# Patient Record
Sex: Female | Born: 1980 | Race: White | Hispanic: No | Marital: Single | Smoking: Never smoker
Health system: Southern US, Community
[De-identification: ages and names within clinical notes are randomized; demographics above are authoritative.]

## PROBLEM LIST (undated history)

## (undated) DIAGNOSIS — Z789 Other specified health status: Secondary | ICD-10-CM

## (undated) HISTORY — DX: Other specified health status: Z78.9

## (undated) HISTORY — PX: NO PAST SURGERIES: SHX2092

---

## 2004-04-28 ENCOUNTER — Emergency Department (HOSPITAL_COMMUNITY): Admission: EM | Admit: 2004-04-28 | Discharge: 2004-04-28 | Payer: Self-pay | Admitting: Family Medicine

## 2004-05-13 ENCOUNTER — Emergency Department (HOSPITAL_COMMUNITY): Admission: EM | Admit: 2004-05-13 | Discharge: 2004-05-13 | Payer: Self-pay | Admitting: Family Medicine

## 2004-07-01 ENCOUNTER — Emergency Department (HOSPITAL_COMMUNITY): Admission: EM | Admit: 2004-07-01 | Discharge: 2004-07-01 | Payer: Self-pay | Admitting: Family Medicine

## 2004-07-19 ENCOUNTER — Emergency Department (HOSPITAL_COMMUNITY): Admission: EM | Admit: 2004-07-19 | Discharge: 2004-07-19 | Payer: Self-pay | Admitting: Family Medicine

## 2004-08-12 ENCOUNTER — Emergency Department (HOSPITAL_COMMUNITY): Admission: EM | Admit: 2004-08-12 | Discharge: 2004-08-12 | Payer: Self-pay | Admitting: Family Medicine

## 2004-10-18 ENCOUNTER — Emergency Department (HOSPITAL_COMMUNITY): Admission: EM | Admit: 2004-10-18 | Discharge: 2004-10-18 | Payer: Self-pay | Admitting: Family Medicine

## 2006-02-16 ENCOUNTER — Other Ambulatory Visit: Admission: RE | Admit: 2006-02-16 | Discharge: 2006-02-16 | Payer: Self-pay | Admitting: Gynecology

## 2007-04-26 ENCOUNTER — Other Ambulatory Visit: Admission: RE | Admit: 2007-04-26 | Discharge: 2007-04-26 | Payer: Self-pay | Admitting: Gynecology

## 2020-07-18 NOTE — L&D Delivery Note (Addendum)
OB/GYN Faculty Practice Delivery Note  Katrina Jacobson is a 40 y.o. G4P0030 s/p NSVD at 106w5d. She was admitted for SROM.   ROM: 30h 31m with clear/pink fluid GBS Status: Negative  Delivery Date/Time: 02/11/21 at 11:02  Delivery: Called to room and patient was complete and pushing. Head delivered occiput anterior. No nuchal cord present. Shoulder and body delivered in usual fashion. Infant with spontaneous cry, placed on mother's abdomen, dried and stimulated. Cord clamped x 2 after 1-minute delay, and cut by family member under direct supervision. Cord blood drawn. Placenta delivered spontaneously with gentle cord traction. Fundus initially remained boggy after massage and Pitocin. Given Methergine 0.2 mg and Cytotec 1000 mcg with improvement in bleeding and uterine tone. Labia, perineum, vagina, and cervix were inspected. Patient found to have 1st degree perineal laceration and bilateral periurethral lacerations. Perineal and right periurethral lacerations repaired with 3-0 Vicryl and 4-0 Monocryl respectively. Left periurethral laceration hemostatic and not repaired. Good hemostasis noted.   Placenta: Intact; 3 vessel cord Complications: Postpartum hemorrhage (s/p Pitocin, Methergine, Cytotec) Lacerations: 1st degree perineal laceration; bilateral periurethral lacerations EBL: 550 cc Analgesia: Epidural  Infant: Viable female  APGARs 8/9  [name], MD OB/GYN Fellow, Faculty Practice   I attest that I was gowned and gloved for the entirety of the delivery and placental delivery; I agree with the above documentation and findings.  [name]

## 2020-12-24 ENCOUNTER — Other Ambulatory Visit: Payer: Self-pay

## 2020-12-24 ENCOUNTER — Encounter: Payer: Self-pay | Admitting: Obstetrics and Gynecology

## 2020-12-24 ENCOUNTER — Ambulatory Visit (INDEPENDENT_AMBULATORY_CARE_PROVIDER_SITE_OTHER): Payer: Self-pay | Admitting: Obstetrics and Gynecology

## 2020-12-24 DIAGNOSIS — O0990 Supervision of high risk pregnancy, unspecified, unspecified trimester: Secondary | ICD-10-CM | POA: Insufficient documentation

## 2020-12-24 MED ORDER — PREPLUS 27-1 MG PO TABS: 1.0000 | ORAL_TABLET | Freq: Every day | ORAL | 13 refills | Status: DC

## 2020-12-24 NOTE — Progress Notes (Signed)
  Subjective:    Katrina Jacobson is a [id number] [redacted]w[redacted]d being seen today for her first obstetrical visit.  Her obstetrical history is significant for advanced maternal age. Patient does intend to breast feed. Pregnancy history fully reviewed.  Patient reports no complaints.  Has been living in [place]. Works as Forensic psychologist. Had regular PNC in [place]. Reports she moved back to [place] to deliver, but plans to return to [place] afterwards.   Intends to have another pregnancy after this one, declines birth control   Feeling great, has loved being pregnant. Reports no concerns, no issues with pregnancy.      Vitals:   12/24/20 1525 12/24/20 1527  BP: 111/69   Pulse: 88   Weight: 172 lb (78 kg)   Height:  5\' 10"  (1.778 m)    HISTORY: OB History  Gravida Para Term Preterm AB Living  4 0 0 0 3 0  SAB IAB Ectopic Multiple Live Births  1 2 0 0 0    # Outcome Date GA Lbr Len/2nd Weight Sex Delivery Anes PTL Lv  4 Current           3 IAB           2 SAB           1 IAB            History reviewed. No pertinent past medical history. History reviewed. No pertinent surgical history. Family History  Problem Relation Age of Onset   Hypertension Mother    Hypertension Father      Exam     Skin: normal coloration and turgor, no rashes    Neurologic: normal, normal mood   Extremities: normal strength, tone, and muscle mass   HEENT PERRLA   Mouth/Teeth mucous membranes moist, pharynx normal without lesions   Neck supple   Cardiovascular: regular rate and rhythm   Respiratory:  appears well, vitals normal, no respiratory distress, acyanotic, normal RR, ear and throat exam is normal, neck free of mass or lymphadenopathy, chest clear, no wheezing, crepitations, rhonchi, normal symmetric air entry   Abdomen: soft, non-tender; bowel sounds normal; no masses,  no organomegaly          Assessment:    Pregnancy: G4P0030 Patient Active Problem List   Diagnosis Date  Noted   Supervision of high risk pregnancy, antepartum 12/24/2020        Plan:    Supervision of Low risk pregnancy - welcomes to practice and oriented to large group with physicians, APPs, midwives Initial labs drawn. Prenatal vitamins. Had been prescribed aspirin in 02/23/2021 but did not take it. Encouraged to take now.  Problem list reviewed and updated.  Size < Dates   Ultrasound discussed; fetal survey: ordered.  Follow up in 2 weeks.   [place] 12/24/2020

## 2020-12-25 LAB — CBC/D/PLT+RPR+RH+ABO+RUBIGG...
Antibody Screen: NEGATIVE
Basophils Absolute: 0.1 10*3/uL (ref 0.0–0.2)
Basos: 1 %
EOS (ABSOLUTE): 0.1 10*3/uL (ref 0.0–0.4)
Eos: 1 %
HCV Ab: <0.1 s/co ratio (ref 0.0–0.9)
HIV Screen 4th Generation wRfx: NONREACTIVE
Hematocrit: 35.9 % (ref 34.0–46.6)
Hemoglobin: 12.3 g/dL (ref 11.1–15.9)
Hepatitis B Surface Ag: NEGATIVE
Immature Grans (Abs): 0.1 10*3/uL (ref 0.0–0.1)
Immature Granulocytes: 1 %
Lymphocytes Absolute: 1.6 10*3/uL (ref 0.7–3.1)
Lymphs: 17 %
MCH: 32.4 pg (ref 26.6–33.0)
MCHC: 34.3 g/dL (ref 31.5–35.7)
MCV: 95 fL (ref 79–97)
Monocytes Absolute: 0.6 10*3/uL (ref 0.1–0.9)
Monocytes: 6 %
Neutrophils Absolute: 7 10*3/uL (ref 1.4–7.0)
Neutrophils: 74 %
Platelets: 261 10*3/uL (ref 150–450)
RBC: 3.8 x10E6/uL (ref 3.77–5.28)
RDW: 12.9 % (ref 11.7–15.4)
RPR Ser Ql: NONREACTIVE
Rh Factor: POSITIVE
Rubella Antibodies, IGG: 1.51 index (ref >0.99)
WBC: 9.5 10*3/uL (ref 3.4–10.8)

## 2020-12-25 LAB — HEMOGLOBIN A1C
Est. average glucose Bld gHb Est-mCnc: 97 mg/dL
Hgb A1c MFr Bld: 5 % (ref 4.8–5.6)

## 2020-12-25 LAB — HCV INTERPRETATION

## 2020-12-26 LAB — CULTURE, OB URINE

## 2020-12-26 LAB — URINE CULTURE, OB REFLEX

## 2020-12-30 ENCOUNTER — Telehealth: Payer: Self-pay

## 2020-12-30 NOTE — Telephone Encounter (Signed)
Mailed patient her GFE with a new patient letter and map.  

## 2021-01-07 ENCOUNTER — Ambulatory Visit: Payer: Medicaid Other | Admitting: *Deleted

## 2021-01-07 ENCOUNTER — Ambulatory Visit: Payer: Medicaid Other | Attending: Obstetrics and Gynecology

## 2021-01-07 ENCOUNTER — Encounter: Payer: Self-pay | Admitting: *Deleted

## 2021-01-07 ENCOUNTER — Other Ambulatory Visit: Payer: Self-pay | Admitting: *Deleted

## 2021-01-07 ENCOUNTER — Other Ambulatory Visit: Payer: Self-pay

## 2021-01-07 ENCOUNTER — Ambulatory Visit (INDEPENDENT_AMBULATORY_CARE_PROVIDER_SITE_OTHER): Payer: Medicaid Other | Admitting: Student

## 2021-01-07 VITALS — BP 110/76 | HR 92 | Wt 176.5 lb

## 2021-01-07 VITALS — BP 112/70 | HR 82

## 2021-01-07 DIAGNOSIS — O0990 Supervision of high risk pregnancy, unspecified, unspecified trimester: Secondary | ICD-10-CM | POA: Diagnosis not present

## 2021-01-07 DIAGNOSIS — O09523 Supervision of elderly multigravida, third trimester: Secondary | ICD-10-CM

## 2021-01-07 DIAGNOSIS — Z23 Encounter for immunization: Secondary | ICD-10-CM

## 2021-01-07 DIAGNOSIS — Z3A34 34 weeks gestation of pregnancy: Secondary | ICD-10-CM

## 2021-01-07 NOTE — Addendum Note (Signed)
Addended by: Chrystie Nose on: 01/07/2021 04:16 PM   Modules accepted: Orders

## 2021-01-07 NOTE — Patient Instructions (Signed)
?  Considering Waterbirth? ?Guide for patients at [facility] ([facility]) ?Why consider waterbirth? ?Gentle birth for babies  ?Less pain medicine used in labor  ?May allow for passive descent/less pushing  ?May reduce perineal tears  ?More mobility and instinctive maternal position changes  ?Increased maternal relaxation  ? ?Is waterbirth safe? What are the risks of infection, drowning or other complications? ?Infection:  ?Very low risk (3.7 % for tub vs 4.8% for bed)  ?7 in 8000 waterbirths with documented infection  ?Poorly cleaned equipment most common cause  ?Slightly lower group B strep transmission rate  ?Drowning  ?Maternal:  ?Very low risk  ?Related to seizures or fainting  ?Newborn:  ?Very low risk. No evidence of increased risk of respiratory problems in multiple large studies  ?Physiological protection from breathing under water  ?Avoid underwater birth if there are any fetal complications  ?Once baby's head is out of the water, keep it out.  ?Birth complication  ?Some reports of cord trauma, but risk decreased by bringing baby to surface gradually  ?No evidence of increased risk of shoulder dystocia. Mothers can usually change positions faster in water than in a bed, possibly aiding the maneuvers to free the shoulder.  ? ?There are 2 things you MUST do to have a waterbirth with [facility]: ?Attend a waterbirth class at [facility] [place]   ?3rd Wednesday of every month from 7-9 pm (virtual during COVID) ?Free ?Register online at [email or web address] or [email or web address] or by calling [phone] ?Bring us the certificate from the class to your prenatal appointment or send via MyChart ?Meet with a midwife at 36 weeks* to see if you can still plan a waterbirth and to sign the consent.  ? ?*We also recommend that you schedule as many of your prenatal visits with a midwife as possible.   ? ?Helpful information: ?You may want to bring a bathing suit top to the hospital  to wear during labor but this is optional.  All other supplies are provided by the hospital. ?Please arrive at the hospital with signs of active labor, and do not wait at home until late in labor. It takes 45 min- 2 hours for COVID testing, fetal monitoring, and check in to your room to take place, plus transport and filling of the waterbirth tub.   ? ?Things that would prevent you from having a waterbirth: ?Unknown or Positive COVID-19 diagnosis upon admission to hospital* ?Premature, <37wks  ?Previous cesarean birth  ?Presence of thick meconium-stained fluid  ?Multiple gestation (Twins, triplets, etc.)  ?Uncontrolled diabetes or gestational diabetes requiring medication  ?Hypertension diagnosed in pregnancy or preexisting hypertension (gestational hypertension, preeclampsia, or chronic hypertension) ?Fetal growth restriction (your baby measures less than 10th percentile on ultrasound) ?Heavy vaginal bleeding  ?Non-reassuring fetal heart rate  ?Active infection (MRSA, etc.). Group B Strep is NOT a contraindication for waterbirth.  ?If your labor has to be induced and induction method requires continuous monitoring of the baby's heart rate  ?Other risks/issues identified by your obstetrical provider  ? ?Please remember that birth is unpredictable. Under certain unforeseeable circumstances your provider may advise against giving birth in the tub. These decisions will be made on a case-by-case basis and with the safety of you and your baby as our highest priority. ? ? ?*Please remember that in order to have a waterbirth, you must test Negative to COVID-19 upon admission to the hospital. ? ?Updated 10/26/20 ? ?

## 2021-01-07 NOTE — Progress Notes (Signed)
   PRENATAL VISIT NOTE  Subjective:  Katrina Jacobson is a 40 y.o. G4P0030 at [redacted]w[redacted]d being seen today for ongoing prenatal care.  She is currently monitored for the following issues for this low-risk pregnancy and has Supervision of high risk pregnancy, antepartum on their problem list.  Patient reports no complaints.  She has many questions about waterbirth, sleeping, genetic testing, IOL (is it necessary), birth certificate. Contractions: Irritability. Vag. Bleeding: None.  Movement: Present. Denies leaking of fluid.   The following portions of the patient's history were reviewed and updated as appropriate: allergies, current medications, past family history, past medical history, past social history, past surgical history and problem list.   Objective:   Vitals:   01/07/21 1400  BP: 110/76  Pulse: 92  Weight: 176 lb 8 oz (80.1 kg)    Fetal Status: Fetal Heart Rate (bpm): 138 Fundal Height: 35 cm Movement: Present     General:  Alert, oriented and cooperative. Patient is in no acute distress.  Skin: Skin is warm and dry. No rash noted.   Cardiovascular: Normal heart rate noted  Respiratory: Normal respiratory effort, no problems with respiration noted  Abdomen: Soft, gravid, appropriate for gestational age.  Pain/Pressure: Present     Pelvic: Cervical exam deferred        Extremities: Normal range of motion.  Edema: None  Mental Status: Normal mood and affect. Normal behavior. Normal judgment and thought content.   Assessment and Plan:  Pregnancy: G4P0030 at [redacted]w[redacted]d 1. Supervision of high risk pregnancy, antepartum -Declines Birth control, may want more children -anticipatory guidance for 36 week cultures, exam -Reviewed [place] today, normal anatomy -Reviewed possible IOL at 39 weeks, patient declines for now but will continue going to antenatal testing -Desires wb, given class information and knows she must attend class for WB. Reviewed restrictions -Will check HbA1c today as  patient passed GTT in [place] but does not have results -Patient has questions about birth certificate and starting her maternity leave; suggested she call [employer] cabin crew and discuss -Will do Genetic testing today -Tdap today -discussed comfort measures of sleeping, answered questions about delaying vaccinations at birth  Preterm labor symptoms and general obstetric precautions including but not limited to vaginal bleeding, contractions, leaking of fluid and fetal movement were reviewed in detail with the patient. Please refer to After Visit Summary for other counseling recommendations.   Return in about 2 weeks (around 01/21/2021), or LROB with KK.  Future Appointments  Date Time Provider Department Center  01/28/2021  2:15 PM [facility]  01/28/2021  3:15 PM Constant, Gigi Gin, MD [facility]    Marylene Land, [place]

## 2021-01-08 LAB — HEMOGLOBIN A1C
Est. average glucose Bld gHb Est-mCnc: 100 mg/dL
Hgb A1c MFr Bld: 5.1 % (ref 4.8–5.6)

## 2021-01-21 ENCOUNTER — Other Ambulatory Visit: Payer: Self-pay

## 2021-01-21 ENCOUNTER — Other Ambulatory Visit (HOSPITAL_COMMUNITY)
Admission: RE | Admit: 2021-01-21 | Discharge: 2021-01-21 | Disposition: A | Discharge status: Home / Self Care | Payer: Medicaid Other | Source: Ambulatory Visit

## 2021-01-21 ENCOUNTER — Ambulatory Visit (INDEPENDENT_AMBULATORY_CARE_PROVIDER_SITE_OTHER): Payer: Medicaid Other

## 2021-01-21 VITALS — BP 121/84 | HR 103 | Wt 180.8 lb

## 2021-01-21 DIAGNOSIS — Z3A36 36 weeks gestation of pregnancy: Secondary | ICD-10-CM | POA: Insufficient documentation

## 2021-01-21 DIAGNOSIS — O0990 Supervision of high risk pregnancy, unspecified, unspecified trimester: Secondary | ICD-10-CM | POA: Diagnosis not present

## 2021-01-21 DIAGNOSIS — O09529 Supervision of elderly multigravida, unspecified trimester: Secondary | ICD-10-CM

## 2021-01-21 NOTE — Progress Notes (Signed)
   HIGH-RISK PREGNANCY OFFICE VISIT  Patient name: Katrina Jacobson MRN [id number]  Date of birth: 08/29/1980 Chief Complaint:   Routine Prenatal Visit  Subjective:   Katrina Jacobson is a 40 y.o. G49P0030 female at [redacted]w[redacted]d with an Estimated Date of Delivery: 02/13/21 being seen today for ongoing management of a high-risk pregnancy aeb has Supervision of high risk pregnancy, antepartum and Antepartum multigravida of advanced maternal age on their problem list.  Patient presents today with pregnancy questions.  She reports "loose stools," but not watery.  Patient also has questions regarding birth certificate process.  Patient endorses fetal movement. Patient denies abdominal cramping or contractions.  Patient denies vaginal concerns including abnormal discharge, leaking of fluid, and bleeding.  Contractions: Irritability. Vag. Bleeding: None.  Movement: Present.  Reviewed past medical,surgical, social, obstetrical and family history as well as problem list, medications and allergies.  Objective   Vitals:   01/21/21 1443  BP: 121/84  Pulse: (!) 103  Weight: 180 lb 12.8 oz (82 kg)  Body mass index is 25.94 kg/m.  Total Weight Gain:40 lb 12.8 oz (18.5 kg)         Physical Examination:   General appearance: Well appearing, and in no distress  Mental status: Alert, oriented to person, place, and time  Skin: Warm & dry  Cardiovascular: Normal heart rate noted  Respiratory: Normal respiratory effort, no distress  Abdomen: Soft, gravid, nontender, AGA with Fundal Height: 36 cm  Pelvic: Cervical exam performed  Dilation: Closed Effacement (%): 0 Station: Ballotable Presentation: Vertex  Extremities: Edema: None  Fetal Status: Fetal Heart Rate (bpm): 154  Movement: Present   No results found for this or any previous visit (from the past 24 hour(s)).  Assessment & Plan:  High-risk pregnancy of a 40 y.o., G4P0030 at [redacted]w[redacted]d with an Estimated Date of Delivery: 02/13/21   1. Supervision of high  risk pregnancy, antepartum -Anticipatory guidance for upcoming appts. -Patient to schedule next appt in 1 weeks for an in-person visit. -Labs as below. -Educated on GBS bacteria including what it is, why we test, and how and when we treat if needed. -Discussed releasing of results to mychart.  2. [redacted] weeks gestation of pregnancy -Doing well. -Reassured that loose stools are of note, but not concerning as it is reflective of dietary intake. -Discussed registration process after consulting with A. Garner Nash who was able to provide insight. I thank her for her time and information provided.  3. Antepartum multigravida of advanced maternal age -MFM recommendation of IOL at 39 weeks.      Meds: No orders of the defined types were placed in this encounter.  Labs/procedures today:  Lab Orders  Culture, beta strep (group b only)     Reviewed: Term labor symptoms and general obstetric precautions including but not limited to vaginal bleeding, contractions, leaking of fluid and fetal movement were reviewed in detail with the patient.  All questions were answered.  Follow-up: Return in about 1 week (around 01/28/2021) for HROB.  Orders Placed This Encounter  Procedures   Culture, beta strep (group b only)   Cherre Robins MSN, CNM 01/21/2021

## 2021-01-22 ENCOUNTER — Other Ambulatory Visit: Payer: Medicaid Other

## 2021-01-22 ENCOUNTER — Ambulatory Visit: Payer: Medicaid Other

## 2021-01-22 ENCOUNTER — Encounter: Payer: Self-pay | Admitting: General Practice

## 2021-01-22 LAB — CERVICOVAGINAL ANCILLARY ONLY
Chlamydia: NEGATIVE
Comment: NEGATIVE
Comment: NORMAL
Neisseria Gonorrhea: NEGATIVE

## 2021-01-26 LAB — CULTURE, BETA STREP (GROUP B ONLY): Strep Gp B Culture: NEGATIVE

## 2021-01-28 ENCOUNTER — Ambulatory Visit (INDEPENDENT_AMBULATORY_CARE_PROVIDER_SITE_OTHER): Payer: Medicaid Other | Admitting: Obstetrics and Gynecology

## 2021-01-28 ENCOUNTER — Other Ambulatory Visit: Payer: Self-pay

## 2021-01-28 ENCOUNTER — Ambulatory Visit (INDEPENDENT_AMBULATORY_CARE_PROVIDER_SITE_OTHER): Payer: Medicaid Other

## 2021-01-28 ENCOUNTER — Encounter: Payer: Self-pay | Admitting: Obstetrics and Gynecology

## 2021-01-28 ENCOUNTER — Ambulatory Visit: Payer: Medicaid Other | Admitting: *Deleted

## 2021-01-28 VITALS — BP 125/87 | HR 82 | Wt 183.6 lb

## 2021-01-28 DIAGNOSIS — O09529 Supervision of elderly multigravida, unspecified trimester: Secondary | ICD-10-CM | POA: Diagnosis not present

## 2021-01-28 DIAGNOSIS — O0990 Supervision of high risk pregnancy, unspecified, unspecified trimester: Secondary | ICD-10-CM

## 2021-01-28 NOTE — Progress Notes (Signed)
Pt

## 2021-01-28 NOTE — Progress Notes (Signed)
   PRENATAL VISIT NOTE  Subjective:  Katrina Jacobson is a 40 y.o. G4P0030 at [redacted]w[redacted]d being seen today for ongoing prenatal care.  She is currently monitored for the following issues for this high-risk pregnancy and has Supervision of high risk pregnancy, antepartum and Antepartum multigravida of advanced maternal age on their problem list.  Patient reports no complaints.   . Vag. Bleeding: Scant.  Movement: Present. Denies leaking of fluid.   The following portions of the patient's history were reviewed and updated as appropriate: allergies, current medications, past family history, past medical history, past social history, past surgical history and problem list.   Objective:   Vitals:   01/28/21 1448  BP: 125/87  Pulse: 82  Weight: 183 lb 9.6 oz (83.3 kg)    Fetal Status: Fetal Heart Rate (bpm): RNST Fundal Height: 37 cm Movement: Present     General:  Alert, oriented and cooperative. Patient is in no acute distress.  Skin: Skin is warm and dry. No rash noted.   Cardiovascular: Normal heart rate noted  Respiratory: Normal respiratory effort, no problems with respiration noted  Abdomen: Soft, gravid, appropriate for gestational age.  Pain/Pressure: Present     Pelvic: Cervical exam deferred        Extremities: Normal range of motion.  Edema: None  Mental Status: Normal mood and affect. Normal behavior. Normal judgment and thought content.   Assessment and Plan:  Pregnancy: G4P0030 at [redacted]w[redacted]d 1. Supervision of high risk pregnancy, antepartum Patient is doing well without complaints  2. Antepartum multigravida of advanced maternal age BPP 10/10 Continue weekly antenatal testing until delivery Patient declined IOL at 39 weeks but agrees to IOL on her due date. Will schedule at her next visit  Term labor symptoms and general obstetric precautions including but not limited to vaginal bleeding, contractions, leaking of fluid and fetal movement were reviewed in detail with the  patient. Please refer to After Visit Summary for other counseling recommendations.   Return in about 1 week (around 02/04/2021) for Ob f/u and NST/BPP.  No future appointments.  Catalina Antigua, MD

## 2021-02-02 ENCOUNTER — Encounter: Payer: Self-pay | Admitting: *Deleted

## 2021-02-02 ENCOUNTER — Ambulatory Visit (INDEPENDENT_AMBULATORY_CARE_PROVIDER_SITE_OTHER): Payer: Medicaid Other | Admitting: Family Medicine

## 2021-02-02 ENCOUNTER — Ambulatory Visit (INDEPENDENT_AMBULATORY_CARE_PROVIDER_SITE_OTHER): Payer: Medicaid Other | Admitting: General Practice

## 2021-02-02 ENCOUNTER — Other Ambulatory Visit: Payer: Self-pay

## 2021-02-02 ENCOUNTER — Ambulatory Visit (INDEPENDENT_AMBULATORY_CARE_PROVIDER_SITE_OTHER): Payer: Medicaid Other

## 2021-02-02 ENCOUNTER — Encounter: Payer: Self-pay | Admitting: Family Medicine

## 2021-02-02 VITALS — BP 119/79 | HR 78

## 2021-02-02 DIAGNOSIS — O09529 Supervision of elderly multigravida, unspecified trimester: Secondary | ICD-10-CM

## 2021-02-02 DIAGNOSIS — O0990 Supervision of high risk pregnancy, unspecified, unspecified trimester: Secondary | ICD-10-CM

## 2021-02-02 DIAGNOSIS — Z3A38 38 weeks gestation of pregnancy: Secondary | ICD-10-CM | POA: Diagnosis not present

## 2021-02-02 NOTE — Progress Notes (Signed)
   Subjective:  Katrina Jacobson is a 40 y.o. G4P0030 at [redacted]w[redacted]d being seen today for ongoing prenatal care.  She is currently monitored for the following issues for this high-risk pregnancy and has Supervision of high risk pregnancy, antepartum and Antepartum multigravida of advanced maternal age on their problem list.  Patient reports no complaints.  Contractions: Irritability. Vag. Bleeding: None.  Movement: Present. Denies leaking of fluid.   The following portions of the patient's history were reviewed and updated as appropriate: allergies, current medications, past family history, past medical history, past social history, past surgical history and problem list. Problem list updated.  Objective:   Vitals:   02/02/21 1406  BP: 119/79  Pulse: 78    Fetal Status: Fetal Heart Rate (bpm): NST   Movement: Present     General:  Alert, oriented and cooperative. Patient is in no acute distress.  Skin: Skin is warm and dry. No rash noted.   Cardiovascular: Normal heart rate noted  Respiratory: Normal respiratory effort, no problems with respiration noted  Abdomen: Soft, gravid, appropriate for gestational age. Pain/Pressure: Present     Pelvic: Vag. Bleeding: None     Cervical exam deferred        Extremities: Normal range of motion.  Edema: None  Mental Status: Normal mood and affect. Normal behavior. Normal judgment and thought content.   Urinalysis:      Assessment and Plan:  Pregnancy: G4P0030 at [redacted]w[redacted]d  1. Supervision of high risk pregnancy, antepartum BP normal NST reactive  2. Antepartum multigravida of advanced maternal age Long discussion with patient around recommendation for IOL Patient strongly desires to wait for natural birth as long as possible Discussed that OB is a balance of risk and benefits, and that if there is a recommendation for IOL then that is because it is generally agreed that the benefits outweigh potential risks/downsides In this case discussed we  mainly trying to avoid (as with most things) stillbirth and neonatal complications, which admittedly are low absolute risks Currently scheduled for IOL on her due date Discussed that as long as there is reassuring antenatal testing we could compromise on going a little past her due date, but I would recommend getting induced by some point in her 40th week She will consider our discussion and check back in I also recommended using all methods available to help start natural labor (intercourse, pineapple, dates, evening primrose oil, etc.) Also offered membrane sweep, she will consider for next visit BPP reportedly 10/10 today  Term labor symptoms and general obstetric precautions including but not limited to vaginal bleeding, contractions, leaking of fluid and fetal movement were reviewed in detail with the patient. Please refer to After Visit Summary for other counseling recommendations.  Return in 1 week (on 02/09/2021) for ob visit.   Venora Maples, MD

## 2021-02-02 NOTE — Patient Instructions (Signed)

## 2021-02-02 NOTE — Progress Notes (Signed)
Pt informed that the ultrasound is considered a limited OB ultrasound and is not intended to be a complete ultrasound exam.  Patient also informed that the ultrasound is not being completed with the intent of assessing for fetal or placental anomalies or any pelvic abnormalities.  Explained that the purpose of today's ultrasound is to assess for  BPP, presentation, and AFI.  Patient acknowledges the purpose of the exam and the limitations of the study.     Elasha Tess H RN BSN 02/02/21  

## 2021-02-03 ENCOUNTER — Encounter: Payer: Self-pay | Admitting: *Deleted

## 2021-02-04 ENCOUNTER — Ambulatory Visit (INDEPENDENT_AMBULATORY_CARE_PROVIDER_SITE_OTHER): Payer: Medicaid Other | Admitting: Pediatrics

## 2021-02-04 ENCOUNTER — Other Ambulatory Visit: Payer: Self-pay

## 2021-02-04 DIAGNOSIS — Z7681 Expectant parent(s) prebirth pediatrician visit: Secondary | ICD-10-CM

## 2021-02-04 NOTE — Progress Notes (Signed)
Prenatal counseling for impending newborn done-- Z76.81  

## 2021-02-10 ENCOUNTER — Encounter: Payer: Medicaid Other | Admitting: Obstetrics and Gynecology

## 2021-02-10 ENCOUNTER — Inpatient Hospital Stay (HOSPITAL_COMMUNITY)
Admission: AD | Admit: 2021-02-10 | Discharge: 2021-02-13 | DRG: 807 | Disposition: A | Discharge status: Home / Self Care | Payer: Medicaid Other | Attending: Obstetrics and Gynecology | Admitting: Obstetrics and Gynecology

## 2021-02-10 ENCOUNTER — Encounter (HOSPITAL_COMMUNITY): Payer: Self-pay | Admitting: Obstetrics and Gynecology

## 2021-02-10 ENCOUNTER — Other Ambulatory Visit: Payer: Self-pay

## 2021-02-10 ENCOUNTER — Inpatient Hospital Stay (HOSPITAL_COMMUNITY): Payer: Medicaid Other | Admitting: Anesthesiology

## 2021-02-10 DIAGNOSIS — O4202 Full-term premature rupture of membranes, onset of labor within 24 hours of rupture: Secondary | ICD-10-CM | POA: Diagnosis not present

## 2021-02-10 DIAGNOSIS — O26893 Other specified pregnancy related conditions, third trimester: Secondary | ICD-10-CM | POA: Diagnosis present

## 2021-02-10 DIAGNOSIS — R030 Elevated blood-pressure reading, without diagnosis of hypertension: Secondary | ICD-10-CM | POA: Diagnosis present

## 2021-02-10 DIAGNOSIS — O09529 Supervision of elderly multigravida, unspecified trimester: Secondary | ICD-10-CM

## 2021-02-10 DIAGNOSIS — Z20822 Contact with and (suspected) exposure to covid-19: Secondary | ICD-10-CM | POA: Diagnosis present

## 2021-02-10 DIAGNOSIS — O9081 Anemia of the puerperium: Secondary | ICD-10-CM

## 2021-02-10 DIAGNOSIS — Z3A39 39 weeks gestation of pregnancy: Secondary | ICD-10-CM | POA: Diagnosis not present

## 2021-02-10 DIAGNOSIS — O1404 Mild to moderate pre-eclampsia, complicating childbirth: Secondary | ICD-10-CM | POA: Diagnosis not present

## 2021-02-10 DIAGNOSIS — O0990 Supervision of high risk pregnancy, unspecified, unspecified trimester: Secondary | ICD-10-CM

## 2021-02-10 LAB — RPR: RPR Ser Ql: NONREACTIVE

## 2021-02-10 LAB — TYPE AND SCREEN
ABO/RH(D): O POS
Antibody Screen: NEGATIVE

## 2021-02-10 LAB — RESP PANEL BY RT-PCR (FLU A&B, COVID) ARPGX2
Influenza A by PCR: NEGATIVE
Influenza B by PCR: NEGATIVE
SARS Coronavirus 2 by RT PCR: NEGATIVE

## 2021-02-10 LAB — CBC
HCT: 36.4 % (ref 36.0–46.0)
Hemoglobin: 12.7 g/dL (ref 12.0–15.0)
MCH: 33 pg (ref 26.0–34.0)
MCHC: 34.9 g/dL (ref 30.0–36.0)
MCV: 94.5 fL (ref 80.0–100.0)
Platelets: 237 10*3/uL (ref 150–400)
RBC: 3.85 MIL/uL — ABNORMAL LOW (ref 3.87–5.11)
RDW: 13.6 % (ref 11.5–15.5)
WBC: 12.2 10*3/uL — ABNORMAL HIGH (ref 4.0–10.5)
nRBC: 0 % (ref 0.0–0.2)

## 2021-02-10 LAB — COMPREHENSIVE METABOLIC PANEL
ALT: 19 U/L (ref 0–44)
AST: 25 U/L (ref 15–41)
Albumin: 2.8 g/dL — ABNORMAL LOW (ref 3.5–5.0)
Alkaline Phosphatase: 142 U/L — ABNORMAL HIGH (ref 38–126)
Anion gap: 8 (ref 5–15)
BUN: 5 mg/dL — ABNORMAL LOW (ref 6–20)
CO2: 22 mmol/L (ref 22–32)
Calcium: 8.7 mg/dL — ABNORMAL LOW (ref 8.9–10.3)
Chloride: 102 mmol/L (ref 98–111)
Creatinine, Ser: 0.6 mg/dL (ref 0.44–1.00)
GFR, Estimated: >60 mL/min (ref >60)
Glucose, Bld: 80 mg/dL (ref 70–99)
Potassium: 3.9 mmol/L (ref 3.5–5.1)
Sodium: 132 mmol/L — ABNORMAL LOW (ref 135–145)
Total Bilirubin: 0.8 mg/dL (ref 0.3–1.2)
Total Protein: 6.1 g/dL — ABNORMAL LOW (ref 6.5–8.1)

## 2021-02-10 LAB — POCT FERN TEST: POCT Fern Test: POSITIVE

## 2021-02-10 LAB — PROTEIN / CREATININE RATIO, URINE
Creatinine, Urine: 59.06 mg/dL
Protein Creatinine Ratio: 0.54 mg/mg{Cre} — ABNORMAL HIGH (ref 0.00–0.15)
Total Protein, Urine: 32 mg/dL

## 2021-02-10 MED ORDER — ONDANSETRON HCL 4 MG/2ML IJ SOLN
4.0000 mg | Freq: Four times a day (QID) | INTRAMUSCULAR | Status: DC | PRN
  Administered 2021-02-11 (×2): 4 mg via INTRAVENOUS
  Filled 2021-02-10 (×2): qty 2

## 2021-02-10 MED ORDER — EPHEDRINE 5 MG/ML INJ: 10.0000 mg | INTRAVENOUS | Status: DC | PRN

## 2021-02-10 MED ORDER — OXYCODONE-ACETAMINOPHEN 5-325 MG PO TABS: 2.0000 | ORAL_TABLET | ORAL | Status: DC | PRN

## 2021-02-10 MED ORDER — LACTATED RINGERS IV SOLN
500.0000 mL | Freq: Once | INTRAVENOUS | Status: AC
  Administered 2021-02-10: 500 mL via INTRAVENOUS

## 2021-02-10 MED ORDER — PHENYLEPHRINE 40 MCG/ML (10ML) SYRINGE FOR IV PUSH (FOR BLOOD PRESSURE SUPPORT): 80.0000 ug | PREFILLED_SYRINGE | INTRAVENOUS | Status: DC | PRN

## 2021-02-10 MED ORDER — DIPHENHYDRAMINE HCL 50 MG/ML IJ SOLN
12.5000 mg | INTRAMUSCULAR | Status: DC | PRN
Start: 2021-02-10 — End: 2021-02-11

## 2021-02-10 MED ORDER — OXYCODONE-ACETAMINOPHEN 5-325 MG PO TABS: 1.0000 | ORAL_TABLET | ORAL | Status: DC | PRN

## 2021-02-10 MED ORDER — OXYTOCIN-SODIUM CHLORIDE 30-0.9 UT/500ML-% IV SOLN: 2.5000 [IU]/h | INTRAVENOUS | Status: DC

## 2021-02-10 MED ORDER — LACTATED RINGERS IV SOLN: INTRAVENOUS | Status: DC

## 2021-02-10 MED ORDER — OXYTOCIN-SODIUM CHLORIDE 30-0.9 UT/500ML-% IV SOLN
1.0000 m[IU]/min | INTRAVENOUS | Status: DC
  Administered 2021-02-10: 2 m[IU]/min via INTRAVENOUS
  Filled 2021-02-10: qty 500

## 2021-02-10 MED ORDER — SOD CITRATE-CITRIC ACID 500-334 MG/5ML PO SOLN: 30.0000 mL | ORAL | Status: DC | PRN

## 2021-02-10 MED ORDER — FLEET ENEMA 7-19 GM/118ML RE ENEM: 1.0000 | ENEMA | RECTAL | Status: DC | PRN

## 2021-02-10 MED ORDER — ACETAMINOPHEN 325 MG PO TABS: 650.0000 mg | ORAL_TABLET | ORAL | Status: DC | PRN

## 2021-02-10 MED ORDER — OXYTOCIN BOLUS FROM INFUSION
333.0000 mL | Freq: Once | INTRAVENOUS | Status: AC
  Administered 2021-02-11: 333 mL via INTRAVENOUS

## 2021-02-10 MED ORDER — TERBUTALINE SULFATE 1 MG/ML IJ SOLN: 0.2500 mg | Freq: Once | INTRAMUSCULAR | Status: DC | PRN

## 2021-02-10 MED ORDER — FENTANYL-BUPIVACAINE-NACL 0.5-0.125-0.9 MG/250ML-% EP SOLN
12.0000 mL/h | EPIDURAL | Status: DC | PRN
  Administered 2021-02-10 – 2021-02-11 (×2): 12 mL/h via EPIDURAL
  Filled 2021-02-10 (×2): qty 250

## 2021-02-10 MED ORDER — LIDOCAINE-EPINEPHRINE (PF) 2 %-1:200000 IJ SOLN
INTRAMUSCULAR | Status: DC | PRN
  Administered 2021-02-10: 5 mL via EPIDURAL

## 2021-02-10 MED ORDER — LIDOCAINE HCL (PF) 1 % IJ SOLN: 30.0000 mL | INTRAMUSCULAR | Status: DC | PRN

## 2021-02-10 MED ORDER — LACTATED RINGERS IV SOLN
500.0000 mL | INTRAVENOUS | Status: DC | PRN
  Administered 2021-02-11 (×2): 500 mL via INTRAVENOUS

## 2021-02-10 NOTE — Progress Notes (Signed)
Katrina Jacobson is a 40 y.o. G4P0030 at [redacted]w[redacted]d admitted for active labor, rupture of membranes  Subjective:   Objective: BP 115/77   Pulse 78   Temp 98.7 F (37.1 C) (Oral)   Resp 17   Ht 5\' 10"  (1.778 m)   Wt 83.3 kg   LMP 05/09/2020   SpO2 100%   BMI 26.34 kg/m  No intake/output data recorded. No intake/output data recorded.  FHT:  FHR: 145 bpm, variability: moderate,  accelerations:  Present,  decelerations:  Absent UC:   regular, every 2-4 minutes SVE:  5/100/-1  Labs: Lab Results  Component Value Date   WBC 12.2 (H) 02/10/2021   HGB 12.7 02/10/2021   HCT 36.4 02/10/2021   MCV 94.5 02/10/2021   PLT 237 02/10/2021    Assessment / Plan: Spontaneous labor, progressing normally  Labor: Progressing normally Preeclampsia:   NA Fetal Wellbeing:  Category I Pain Control:  Epidural I/D:  n/a Anticipated MOD:  NSVD  Offered patient to start pitocin now or continue to monitor progress. Patient has made change with every check, and she would like to continue to labor without pitocin for now. She is open to starting pitocin if progress stalls.   02/12/2021 DNP, CNM  02/10/21  5:47 PM

## 2021-02-10 NOTE — Progress Notes (Signed)
Main lab contacted by RN due to delay in results from CBC. Lab tech states machine couldn't read the label and it would have to be re-run by the machine. Pt. In pain and waiting for epidural. Delay due to lab.

## 2021-02-10 NOTE — Anesthesia Procedure Notes (Signed)
Epidural Patient location during procedure: OB Start time: 02/10/2021 10:00 AM End time: 02/10/2021 10:10 AM  Staffing Anesthesiologist: Elmer Picker, MD Performed: anesthesiologist   Preanesthetic Checklist Completed: patient identified, IV checked, risks and benefits discussed, monitors and equipment checked, pre-op evaluation and timeout performed  Epidural Patient position: sitting Prep: DuraPrep and site prepped and draped Patient monitoring: continuous pulse ox, blood pressure, heart rate and cardiac monitor Approach: midline Location: L3-L4 Injection technique: LOR air  Needle:  Needle type: Tuohy  Needle gauge: 17 G Needle length: 9 cm Needle insertion depth: 5 cm Catheter type: closed end flexible Catheter size: 19 Gauge Catheter at skin depth: 10 cm Test dose: negative  Assessment Sensory level: T8 Events: blood not aspirated, injection not painful, no injection resistance, no paresthesia and negative IV test  Additional Notes Patient identified. Risks/Benefits/Options discussed with patient including but not limited to bleeding, infection, nerve damage, paralysis, failed block, incomplete pain control, headache, blood pressure changes, nausea, vomiting, reactions to medication both or allergic, itching and postpartum back pain. Confirmed with bedside nurse the patient's most recent platelet count. Confirmed with patient that they are not currently taking any anticoagulation, have any bleeding history or any family history of bleeding disorders. Patient expressed understanding and wished to proceed. All questions were answered. Sterile technique was used throughout the entire procedure. Please see nursing notes for vital signs. Test dose was given through epidural catheter and negative prior to continuing to dose epidural or start infusion. Warning signs of high block given to the patient including shortness of breath, tingling/numbness in hands, complete motor block,  or any concerning symptoms with instructions to call for help. Patient was given instructions on fall risk and not to get out of bed. All questions and concerns addressed with instructions to call with any issues or inadequate analgesia.  Reason for block:procedure for pain

## 2021-02-10 NOTE — Anesthesia Preprocedure Evaluation (Signed)
Anesthesia Evaluation  Patient identified by MRN, date of birth, ID band Patient awake    Reviewed: Allergy & Precautions, NPO status , Patient's Chart, lab work & pertinent test results  Airway Mallampati: II  TM Distance: >3 FB Neck ROM: Full    Dental no notable dental hx.    Pulmonary neg pulmonary ROS,    Pulmonary exam normal breath sounds clear to auscultation       Cardiovascular negative cardio ROS Normal cardiovascular exam Rhythm:Regular Rate:Normal     Neuro/Psych negative neurological ROS  negative psych ROS   GI/Hepatic negative GI ROS, Neg liver ROS,   Endo/Other  negative endocrine ROS  Renal/GU negative Renal ROS  negative genitourinary   Musculoskeletal negative musculoskeletal ROS (+)   Abdominal   Peds  Hematology negative hematology ROS (+)   Anesthesia Other Findings Presents with SROM  Reproductive/Obstetrics (+) Pregnancy                            Anesthesia Physical Anesthesia Plan  ASA: 2  Anesthesia Plan: Epidural   Post-op Pain Management:    Induction:   PONV Risk Score and Plan: Treatment may vary due to age or medical condition  Airway Management Planned: Natural Airway  Additional Equipment:   Intra-op Plan:   Post-operative Plan:   Informed Consent: I have reviewed the patients History and Physical, chart, labs and discussed the procedure including the risks, benefits and alternatives for the proposed anesthesia with the patient or authorized representative who has indicated his/her understanding and acceptance.       Plan Discussed with: Anesthesiologist  Anesthesia Plan Comments: (Patient identified. Risks, benefits, options discussed with patient including but not limited to bleeding, infection, nerve damage, paralysis, failed block, incomplete pain control, headache, blood pressure changes, nausea, vomiting, reactions to medication,  itching, and post partum back pain. Confirmed with bedside nurse the patient's most recent platelet count. Confirmed with the patient that they are not taking any anticoagulation, have any bleeding history or any family history of bleeding disorders. Patient expressed understanding and wishes to proceed. All questions were answered. )        Anesthesia Quick Evaluation  

## 2021-02-10 NOTE — MAU Note (Signed)
Katrina Jacobson is a 40 y.o. at [redacted]w[redacted]d here in MAU reporting: LOF since 0500, fluid is clear. Having some contractions, they are about every 5 min. Scant bleeding.   Onset of complaint: today  Pain score: 4/10  Vitals:   02/10/21 0814  BP: (!) 135/95  Pulse: 82  Resp: 18  Temp: 98.7 F (37.1 C)  SpO2: 99%     FHT: EFM applied in room, 158  Lab orders placed from triage: fern slide

## 2021-02-10 NOTE — H&P (Addendum)
OBSTETRIC ADMISSION HISTORY AND PHYSICAL  Katrina Jacobson is a 40 y.o. female G21P0030 with IUP at [redacted]w[redacted]d by LMP presenting for labor-SROM@0500  with onset of regular contractions. She reports +FMs, No LOF, no VB, no blurry vision, headaches or peripheral edema, and RUQ pain.  She plans on breast feeding. She requests nothing for birth control. She received her prenatal care at  [facility]    Dating: By LMP --->  Estimated Date of Delivery: 02/13/21  Sono:   01/07/2021@[redacted]w[redacted]d , CWD, normal anatomy, cephalic presentation, anterior placental lie, 2648g, 64% EFW   Prenatal History/Complications:  AMA  Past Medical History: Past Medical History:  Diagnosis Date   Medical history non-contributory     Past Surgical History: Past Surgical History:  Procedure Laterality Date   NO PAST SURGERIES      Obstetrical History: OB History     Gravida  4   Para  0   Term  0   Preterm  0   AB  3   Living  0      SAB  1   IAB  2   Ectopic  0   Multiple  0   Live Births  0           Social History Social History   Socioeconomic History   Marital status: Single    Spouse name: Not on file   Number of children: Not on file   Years of education: Not on file   Highest education level: Not on file  Occupational History   Not on file  Tobacco Use   Smoking status: Never   Smokeless tobacco: Never  Vaping Use   Vaping Use: Never used  Substance and Sexual Activity   Alcohol use: Never   Drug use: Never   Sexual activity: Not Currently  Other Topics Concern   Not on file  Social History Narrative   Not on file   Social Determinants of Health   Financial Resource Strain: Not on file  Food Insecurity: No Food Insecurity   Worried About Running Out of Food in the Last Year: Never true   Ran Out of Food in the Last Year: Never true  Transportation Needs: No Transportation Needs   Lack of Transportation (Medical): No   Lack of Transportation (Non-Medical): No  Physical  Activity: Not on file  Stress: Not on file  Social Connections: Not on file    Family History: Family History  Problem Relation Age of Onset   Hypertension Mother    Hypertension Father     Allergies: Allergies  Allergen Reactions   Sulfa Antibiotics    Latex Itching and Rash    Pt denies allergies to latex, iodine, or shellfish.  Medications Prior to Admission  Medication Sig Dispense Refill Last Dose   Prenatal Vit-Fe Fumarate-FA (PREPLUS) 27-1 MG TABS Take 1 tablet by mouth daily. 30 tablet 13      Review of Systems   All systems reviewed and negative except as stated in HPI  Blood pressure 119/77, pulse 74, temperature 98.7 F (37.1 C), temperature source Oral, resp. rate (P) 16, height 5\' 10"  (1.778 m), weight 83.3 kg, last menstrual period 05/09/2020, SpO2 100 %. General appearance: alert, cooperative, and appears stated age Lungs: normal WOB Abdomen: soft, non-tender; gravid Pelvic: As stated below Extremities: Homans sign is negative, no sign of DVT Presentation:  Vertex Fetal monitoringBaseline: 145 bpm, Variability: Min, Accelerations: -, and  Decelerations: - Uterine activity: contractions q5-60min Dilation: (P) 3 Effacement (%):  90 Station: -2 Exam by:: Erle Crocker, RN   Prenatal labs: ABO, Rh: --/--/O POS (07/27 0830) Antibody: NEG (07/27 0830) Rubella: 1.51 (06/09 1600) RPR: Non Reactive (06/09 1600)  HBsAg: Negative (06/09 1600)  HIV: Non Reactive (06/09 1600)  GBS: Negative/-- (07/07 1556)  2 hr Glucola: passed Genetic screening:  low risk female Anatomy US: nml  Prenatal Transfer Tool  Maternal Diabetes: No Genetic Screening: Normal Maternal Ultrasounds/Referrals: Normal Fetal Ultrasounds or other Referrals:  None Maternal Substance Abuse:  No Significant Maternal Medications:  None Significant Maternal Lab Results: Group B Strep negative  Results for orders placed or performed during the hospital encounter of 02/10/21 (from  the past 24 hour(s))  Resp Panel by RT-PCR (Flu A&B, Covid) Nasopharyngeal Swab   Collection Time: 02/10/21  8:27 AM   Specimen: Nasopharyngeal Swab; Nasopharyngeal(NP) swabs in vial transport medium  Result Value Ref Range   SARS Coronavirus 2 by RT PCR NEGATIVE NEGATIVE   Influenza A by PCR NEGATIVE NEGATIVE   Influenza B by PCR NEGATIVE NEGATIVE  CBC   Collection Time: 02/10/21  8:27 AM  Result Value Ref Range   WBC 12.2 (H) 4.0 - 10.5 K/uL   RBC 3.85 (L) 3.87 - 5.11 MIL/uL   Hemoglobin 12.7 12.0 - 15.0 g/dL   HCT 81.2 75.1 - 70.0 %   MCV 94.5 80.0 - 100.0 fL   MCH 33.0 26.0 - 34.0 pg   MCHC 34.9 30.0 - 36.0 g/dL   RDW 17.4 94.4 - 96.7 %   Platelets 237 150 - 400 K/uL   nRBC 0.0 0.0 - 0.2 %  Comprehensive metabolic panel   Collection Time: 02/10/21  8:27 AM  Result Value Ref Range   Sodium 132 (L) 135 - 145 mmol/L   Potassium 3.9 3.5 - 5.1 mmol/L   Chloride 102 98 - 111 mmol/L   CO2 22 22 - 32 mmol/L   Glucose, Bld 80 70 - 99 mg/dL   BUN 5 (L) 6 - 20 mg/dL   Creatinine, Ser 5.91 0.44 - 1.00 mg/dL   Calcium 8.7 (L) 8.9 - 10.3 mg/dL   Total Protein 6.1 (L) 6.5 - 8.1 g/dL   Albumin 2.8 (L) 3.5 - 5.0 g/dL   AST 25 15 - 41 U/L   ALT 19 0 - 44 U/L   Alkaline Phosphatase 142 (H) 38 - 126 U/L   Total Bilirubin 0.8 0.3 - 1.2 mg/dL   GFR, Estimated >63 >84 mL/min   Anion gap 8 5 - 15  Type and screen [facility]   Collection Time: 02/10/21  8:30 AM  Result Value Ref Range   ABO/RH(D) O POS    Antibody Screen NEG    Sample Expiration      02/13/2021,2359 Performed at [facility], [address] [place], [place] [address]   Protein / creatinine ratio, urine   Collection Time: 02/10/21  8:55 AM  Result Value Ref Range   Creatinine, Urine 59.06 mg/dL   Total Protein, Urine 32 mg/dL   Protein Creatinine Ratio 0.54 (H) 0.00 - 0.15 mg/mg[Cre]  Fern Test   Collection Time: 02/10/21  9:12 AM  Result Value Ref Range   POCT Fern Test Positive =  ruptured amniotic membanes     Patient Active Problem List   Diagnosis Date Noted   Normal labor 02/10/2021   Pediatric pre-birth visit for expectant parent 02/04/2021   Antepartum multigravida of advanced maternal age 28/01/2021   Supervision of high risk  pregnancy, antepartum 12/24/2020    Assessment/Plan:  MICHALINE KINDIG is a 40 y.o. G4P0030 at [redacted]w[redacted]d here for labor-SROM@0500 .  #Labor: Pt is laboring on her own. Will recheck her in a few hours to see if she needs pit.  #Pain: prn #FWB: Min variability but beginning to improve. One variable decel. Overall not concerning.  #ID: GBS neg #MOF: breast #MOC: declined #Circ:  If boy, yes #PreE w/o severe features: Pt has PCR of 0.54. She is asymptomatic and no more elevated Bps. CMP wnl. Will continue to monitor the pt.   Alfredo Martinez, MD  [facility] [employer], [facility] 02/10/2021, 11:08 AM    Attestation of Supervision of Student:  I confirm that I have verified the information documented in the  resident's  note and that I have also personally reperformed the history, physical exam and all medical decision making activities.  I have verified that all services and findings are accurately documented in this student's note; and I agree with management and plan as outlined in the documentation. I have also made any necessary editorial changes.  Thressa Sheller DNP, CNM  02/10/21  12:57 PM

## 2021-02-10 NOTE — Progress Notes (Signed)
Katrina Jacobson is a 40 y.o. G4P0030 at [redacted]w[redacted]d admitted for active labor, rupture of membranes  Subjective: Resting comfortably after her epidural. Question about pitocin and how it works and whether she will need it  Objective: BP 117/80   Pulse 70   Temp (!) 97.5 F (36.4 C) (Oral)   Resp 16   Ht 5\' 10"  (1.778 m)   Wt 83.3 kg   LMP 05/09/2020   SpO2 100%   BMI 26.34 kg/m  No intake/output data recorded. Total I/O In: -  Out: 1075 [Urine:1075]  FHT:  FHR: 140 bpm, variability: moderate,  accelerations:  Present,  decelerations:  one mild variable to 120 UC:   regular, every 2-4 minutes   Labs: Lab Results  Component Value Date   WBC 12.2 (H) 02/10/2021   HGB 12.7 02/10/2021   HCT 36.4 02/10/2021   MCV 94.5 02/10/2021   PLT 237 02/10/2021    Assessment / Plan: Spontaneous labor, progressing normally  Labor: Progressing normally, will start pitocin titrated as per protocol if no cervical change with next check Preeclampsia:   NA Fetal Wellbeing:  Cat 2 but reassuring with only one variable with spontaneous resolution Pain Control:  Epidural I/D:  GBS negative Anticipated MOD:  NSVD   02/12/2021 MD 02/10/21  9:47 PM

## 2021-02-11 ENCOUNTER — Encounter (HOSPITAL_COMMUNITY): Payer: Self-pay | Admitting: Family Medicine

## 2021-02-11 DIAGNOSIS — O4202 Full-term premature rupture of membranes, onset of labor within 24 hours of rupture: Secondary | ICD-10-CM

## 2021-02-11 DIAGNOSIS — O1404 Mild to moderate pre-eclampsia, complicating childbirth: Secondary | ICD-10-CM

## 2021-02-11 DIAGNOSIS — Z3A39 39 weeks gestation of pregnancy: Secondary | ICD-10-CM

## 2021-02-11 MED ORDER — ONDANSETRON HCL 4 MG PO TABS
4.0000 mg | ORAL_TABLET | ORAL | Status: DC | PRN
  Administered 2021-02-11: 4 mg via ORAL
  Filled 2021-02-11: qty 1

## 2021-02-11 MED ORDER — SIMETHICONE 80 MG PO CHEW: 80.0000 mg | CHEWABLE_TABLET | ORAL | Status: DC | PRN

## 2021-02-11 MED ORDER — TETANUS-DIPHTH-ACELL PERTUSSIS 5-2.5-18.5 LF-MCG/0.5 IM SUSY
0.5000 mL | PREFILLED_SYRINGE | Freq: Once | INTRAMUSCULAR | Status: DC
Start: 2021-02-12 — End: 2021-02-11

## 2021-02-11 MED ORDER — METHYLERGONOVINE MALEATE 0.2 MG/ML IJ SOLN
0.2000 mg | Freq: Once | INTRAMUSCULAR | Status: AC
  Administered 2021-02-11: 0.2 mg via INTRAMUSCULAR

## 2021-02-11 MED ORDER — WITCH HAZEL-GLYCERIN EX PADS: 1.0000 "application " | MEDICATED_PAD | CUTANEOUS | Status: DC | PRN

## 2021-02-11 MED ORDER — PRENATAL MULTIVITAMIN CH
1.0000 | ORAL_TABLET | Freq: Every day | ORAL | Status: DC
  Administered 2021-02-12 – 2021-02-13 (×2): 1 via ORAL
  Filled 2021-02-11 (×2): qty 1

## 2021-02-11 MED ORDER — ONDANSETRON HCL 4 MG/2ML IJ SOLN: 4.0000 mg | INTRAMUSCULAR | Status: DC | PRN

## 2021-02-11 MED ORDER — ACETAMINOPHEN 325 MG PO TABS
650.0000 mg | ORAL_TABLET | ORAL | Status: DC | PRN
  Administered 2021-02-11: 650 mg via ORAL
  Filled 2021-02-11 (×2): qty 2

## 2021-02-11 MED ORDER — MISOPROSTOL 200 MCG PO TABS
ORAL_TABLET | ORAL | Status: AC
  Filled 2021-02-11: qty 5

## 2021-02-11 MED ORDER — MISOPROSTOL 200 MCG PO TABS
1000.0000 ug | ORAL_TABLET | Freq: Once | ORAL | Status: AC
  Administered 2021-02-11: 1000 ug via RECTAL

## 2021-02-11 MED ORDER — AMMONIA AROMATIC IN INHA
RESPIRATORY_TRACT | Status: AC
  Filled 2021-02-11: qty 10

## 2021-02-11 MED ORDER — DIPHENHYDRAMINE HCL 25 MG PO CAPS: 25.0000 mg | ORAL_CAPSULE | Freq: Four times a day (QID) | ORAL | Status: DC | PRN

## 2021-02-11 MED ORDER — METHYLERGONOVINE MALEATE 0.2 MG/ML IJ SOLN
INTRAMUSCULAR | Status: AC
  Filled 2021-02-11: qty 1

## 2021-02-11 MED ORDER — SENNOSIDES-DOCUSATE SODIUM 8.6-50 MG PO TABS
2.0000 | ORAL_TABLET | Freq: Every day | ORAL | Status: DC
  Administered 2021-02-12: 2 via ORAL
  Filled 2021-02-11 (×2): qty 2

## 2021-02-11 MED ORDER — BENZOCAINE-MENTHOL 20-0.5 % EX AERO
1.0000 "application " | INHALATION_SPRAY | CUTANEOUS | Status: DC | PRN
  Administered 2021-02-11: 1 via TOPICAL
  Filled 2021-02-11: qty 56

## 2021-02-11 MED ORDER — IBUPROFEN 600 MG PO TABS
600.0000 mg | ORAL_TABLET | Freq: Four times a day (QID) | ORAL | Status: DC
  Administered 2021-02-11 – 2021-02-13 (×8): 600 mg via ORAL
  Filled 2021-02-11 (×8): qty 1

## 2021-02-11 MED ORDER — DIBUCAINE (PERIANAL) 1 % EX OINT: 1.0000 "application " | TOPICAL_OINTMENT | CUTANEOUS | Status: DC | PRN

## 2021-02-11 MED ORDER — COCONUT OIL OIL
1.0000 "application " | TOPICAL_OIL | Status: DC | PRN
  Administered 2021-02-12: 1 via TOPICAL

## 2021-02-11 NOTE — Lactation Note (Signed)
This note was copied from a baby's chart. Lactation Consultation Note  Patient Name: Katrina Jacobson [id number] Date: 02/11/2021 Reason for consult: L&D Initial assessment;Term Age:41 hours  Mother was seen in L&D with first baby. Family at bedside. Mother very happy and teary. Infant STS. Assist with latching infant. Infant sustained latch for 5-10 mins and popped off . Assist with slight adjustment and infant self latched again. Observed wide open mouth with a few swallows.  Mother has semi- flat nipples but infant able to pull them out well.  Encouraged mother to do frequent STS. Mother encouraged to cue base feed infant . Mother informed that RN staff nurse would assist her with breastfeeding support and LC would see her again for more teaching when she get to her room.    Maternal Data Has patient been taught Hand Expression?: Yes Does the patient have breastfeeding experience prior to this delivery?: No  Feeding Mother's Current Feeding Choice: Breast Milk  LATCH Score Latch: Grasps breast easily, tongue down, lips flanged, rhythmical sucking.  Audible Swallowing: Spontaneous and intermittent  Type of Nipple: Everted at rest and after stimulation  Comfort (Breast/Nipple): Soft / non-tender  Hold (Positioning): Assistance needed to correctly position infant at breast and maintain latch.  LATCH Score: 9   Lactation Tools Discussed/Used    Interventions Interventions: Breast compression;Adjust position;Support pillows;Hand express;Assisted with latch;Skin to skin;Breast feeding basics reviewed  Discharge    Consult Status Consult Status: Follow-up Date: 02/11/21 Follow-up type: In-patient    Stevan Born [facility] 02/11/2021, 12:04 PM

## 2021-02-11 NOTE — Progress Notes (Signed)
Katrina Jacobson is a 40 y.o. G4P0030 at [redacted]w[redacted]d admitted for active labor, rupture of membranes  Subjective: Sleeping comfortably, a bit discouraged that she hasn't really made any change yet. Not feeling dizzy but does feel a bit fuzzy  Objective: BP (!) 104/59   Pulse 66   Temp 98.1 F (36.7 C) (Oral)   Resp 16   Ht 5\' 10"  (1.778 m)   Wt 83.3 kg   LMP 05/09/2020   SpO2 100%   BMI 26.34 kg/m  No intake/output data recorded. Total I/O In: -  Out: 1700 [Urine:1700]  FHT:  150 bpm/moderate variability/+accels, neg decels UC:   regular, every 2-4 minutes   Labs: Lab Results  Component Value Date   WBC 12.2 (H) 02/10/2021   HGB 12.7 02/10/2021   HCT 36.4 02/10/2021   MCV 94.5 02/10/2021   PLT 237 02/10/2021    Assessment / Plan: Spontaneous labor, progressing normally  Labor: Progressing normally, Pitocin titrated as per protocol. IUPC placed 0310 Preeclampsia:   NA Fetal Wellbeing:  Cat 1 Pain Control:  Epidural I/D:  GBS negative Anticipated MOD:  NSVD #Elevated BP: one elevated BP this admission, normotensive since. PC ratio elevated but collected post rupture and asymptomatic.    02/12/2021 MD 02/11/21  3:45 AM

## 2021-02-11 NOTE — Anesthesia Postprocedure Evaluation (Signed)
Anesthesia Post Note  Patient: Katrina Jacobson  Procedure(s) Performed: AN AD HOC LABOR EPIDURAL     Patient location during evaluation: Mother Baby Anesthesia Type: Epidural Level of consciousness: awake and alert Pain management: pain level controlled Vital Signs Assessment: post-procedure vital signs reviewed and stable Respiratory status: spontaneous breathing, nonlabored ventilation and respiratory function stable Cardiovascular status: stable Postop Assessment: no headache, no backache, epidural receding, patient able to bend at knees, no apparent nausea or vomiting, able to ambulate and adequate PO intake Anesthetic complications: no   No notable events documented.  Last Vitals:  Vitals:   02/11/21 1410 02/11/21 1702  BP: 113/83 115/78  Pulse: 65 82  Resp: 18   Temp: 37.1 C 36.8 C  SpO2:  100%    Last Pain:  Vitals:   02/11/21 1651  TempSrc:   PainSc: 3    Pain Goal: Patients Stated Pain Goal: 3 (02/11/21 1310)                 Tata Timmins Hristova

## 2021-02-11 NOTE — Progress Notes (Signed)
   Katrina Jacobson is a 40 y.o. G4P0030 at [redacted]w[redacted]d  admitted for SOL Subjective: Very tired   Objective: Vitals:   02/11/21 0700 02/11/21 0731 02/11/21 0900 02/11/21 0933  BP: 127/83 125/80 117/73 112/76  Pulse: 72 68 70 61  Resp:      Temp:      TempSrc:      SpO2:      Weight:      Height:       No intake/output data recorded.  FHT:  FHR: 140 bpm, variability: moderate,  accelerations:  Present,  decelerations:  Present deep variables after contractions with spontaneous recovery UC:   irregular, every 2-3 minutes SVE:   Dilation: 10 Effacement (%): 80 Station: 0, Plus 1 Exam by:: Dr. Nanine Means Pitocin @ 22  mu/min  Labs: Lab Results  Component Value Date   WBC 12.2 (H) 02/10/2021   HGB 12.7 02/10/2021   HCT 36.4 02/10/2021   MCV 94.5 02/10/2021   PLT 237 02/10/2021    Assessment / Plan: Baby is + 3, with deep variables. No urge to push; no descent with pushing.  Will reposition patient to promote rotation; since OB will be in OR will labor down.  Labor:  active labor Fetal Wellbeing:  Category II. Continue reposition, d/c pitocin, DR. Wouk aware Pain Control:  Epidural Anticipated MOD:  NSVD  Marylene Land 02/11/2021, 10:47 AM

## 2021-02-11 NOTE — Discharge Summary (Addendum)
Postpartum Discharge Summary    Patient Name: Katrina Jacobson DOB: [birth year] MRN: [id number]  Date of admission: 02/10/2021 Delivery date:02/11/2021  Delivering provider: Starr Lake  Date of discharge: 02/13/2021  Admitting diagnosis: Normal labor [O80, Z37.9] Intrauterine pregnancy: [redacted]w[redacted]d    Secondary diagnosis:  Active Problems:   Antepartum multigravida of advanced maternal age   Normal labor   Postpartum hemorrhage   Anemia of mother in pregnancy, postpartum condition  Additional problems:    Discharge diagnosis: Term Pregnancy Delivered                                              Post partum procedures: None Augmentation: AROM and Pitocin Complications: None  Hospital course: Onset of Labor With Vaginal Delivery      40y.o. yo G4P0030 at 358w5das admitted in Latent Labor on 02/10/2021. Patient had an uncomplicated labor course as follows: She was seen in MAU with SROM; had one  elevated BP so labs were drawn. She was asymtomatic.  CBC, CMP were normal; PCR was 0.54 but concern that sample was contaminated by amniotic fluid. She required pit for augmentation and delivered a healthy term infant after 2 hours of pushing.  Membrane Rupture Time/Date: 5:00 AM ,02/10/2021   Delivery Method:Vaginal, Spontaneous  Episiotomy: None none Lacerations:  1st degree;Periurethral 1st degree perineal  repaired ; right periurethral repaired, left periurethral hemostatic Patient had an uncomplicated postpartum course.  She is ambulating, tolerating a regular diet, passing flatus, and urinating well. Patient is discharged home in stable condition on 02/13/21.  Newborn Data: Birth date:02/11/2021  Birth time:11:02 AM  Gender:Female  Living status:Living  Apgars:8 ,9  Weight:3374 g   Magnesium Sulfate received: No BMZ received: No Rhophylac:No MMR:N/A T-DaP:Given prenatally Flu: N/A Transfusion:No  Physical exam  Vitals:   02/12/21 0557 02/12/21 1517 02/12/21 2131  02/13/21 0630  BP: 110/74 110/82 132/86 108/68  Pulse: 61 61 69 64  Resp: 17 18 16 17   Temp: 97.9 F (36.6 C) 98 F (36.7 C) 98 F (36.7 C) (!) 97.5 F (36.4 C)  TempSrc: Oral Oral Oral Oral  SpO2: 97% 100% 98% 98%  Weight:      Height:       General: alert, cooperative, and no distress Lochia: appropriate Uterine Fundus: firm Incision: N/A DVT Evaluation: No evidence of DVT seen on physical exam. Labs: Lab Results  Component Value Date   WBC 11.8 (H) 02/12/2021   HGB 9.5 (L) 02/12/2021   HCT 26.5 (L) 02/12/2021   MCV 92.3 02/12/2021   PLT 161 02/12/2021   CMP Latest Ref Rng & Units 02/10/2021  Glucose 70 - 99 mg/dL 80  BUN 6 - 20 mg/dL 5(L)  Creatinine 0.44 - 1.00 mg/dL 0.60  Sodium 135 - 145 mmol/L 132(L)  Potassium 3.5 - 5.1 mmol/L 3.9  Chloride 98 - 111 mmol/L 102  CO2 22 - 32 mmol/L 22  Calcium 8.9 - 10.3 mg/dL 8.7(L)  Total Protein 6.5 - 8.1 g/dL 6.1(L)  Total Bilirubin 0.3 - 1.2 mg/dL 0.8  Alkaline Phos 38 - 126 U/L 142(H)  AST 15 - 41 U/L 25  ALT 0 - 44 U/L 19   Edinburgh Score: Edinburgh Postnatal Depression Scale Screening Tool 02/11/2021  I have been able to laugh and see the funny side of things. 3  I have looked forward with enjoyment to  things. 3  I have blamed myself unnecessarily when things went wrong. 0  I have been anxious or worried for no good reason. 3  I have felt scared or panicky for no good reason. 0  Things have been getting on top of me. 0  I have been so unhappy that I have had difficulty sleeping. 0  I have felt sad or miserable. 0  I have been so unhappy that I have been crying. 0  The thought of harming myself has occurred to me. 0  Edinburgh Postnatal Depression Scale Total 9     After visit meds:  Allergies as of 02/13/2021       Reactions   Sulfa Antibiotics    Latex Itching, Rash        Medication List     TAKE these medications    acetaminophen 325 MG tablet Commonly known as: Tylenol Take 2 tablets (650 mg  total) by mouth every 4 (four) hours as needed for up to 14 days (for pain scale < 4).   docusate sodium 100 MG capsule Commonly known as: COLACE Take 1 capsule (100 mg total) by mouth 2 (two) times daily as needed.   ibuprofen 600 MG tablet Commonly known as: ADVIL Take 1 tablet (600 mg total) by mouth every 6 (six) hours.   iron polysaccharides 150 MG capsule Commonly known as: NIFEREX Take 1 capsule (150 mg total) by mouth every other day.   PrePLUS 27-1 MG Tabs Take 1 tablet by mouth daily.         Discharge home in stable condition Infant Feeding: Breast Infant Disposition:home with mother Discharge instruction: per After Visit Summary and Postpartum booklet. Activity: Advance as tolerated. Pelvic rest for 6 weeks.  Diet: routine diet Future Appointments:No future appointments. Follow up Visit:   Please schedule this patient for a In person postpartum visit in 4 weeks with the following provider:  Maye Hides . Additional Postpartum F/U: None   Low risk pregnancy complicated by:  PPH Delivery mode:  Vaginal, Spontaneous  Anticipated Birth Control:   none   Hemoglobin 9.5 day of discharge. Patient asymptomatic. Advised IV Iron given expected level of activity once discharged. Patient verbalizes preference for oral iron. Prescribed at discharge along with Colace.  Erskine Emery, MD

## 2021-02-11 NOTE — Progress Notes (Signed)
Katrina Jacobson is a 40 y.o. G4P0030 at [redacted]w[redacted]d admitted for active labor, rupture of membranes  Subjective: Feeling a bit nauseous but otherwise doing well  Objective: BP 132/74   Pulse 69   Temp 98.8 F (37.1 C) (Oral)   Resp 16   Ht 5\' 10"  (1.778 m)   Wt 83.3 kg   LMP 05/09/2020   SpO2 100%   BMI 26.34 kg/m  No intake/output data recorded. Total I/O In: -  Out: 2300 [Urine:2300]  FHT:  150 bpm/moderate-mild variability/+accels. Mix of intermittent early, late and variable decels. Last 30 min tracing early to variable UC:   regular, every 2-4 minutes   Labs: Lab Results  Component Value Date   WBC 12.2 (H) 02/10/2021   HGB 12.7 02/10/2021   HCT 36.4 02/10/2021   MCV 94.5 02/10/2021   PLT 237 02/10/2021    Assessment / Plan: Spontaneous labor, progressing normally  Labor: Progressing normally, Pitocin titrated as per protocol. IUPC placed 0310. Now laboring down Preeclampsia:   NA Fetal Wellbeing:  Cat 2 but reassuring and complete Pain Control:  Epidural I/D:  GBS negative Anticipated MOD:  NSVD #Elevated BP: one elevated BP this admission, normotensive since. PC ratio elevated but collected post rupture and asymptomatic.    02/12/2021 MD 02/11/21  6:44 AM

## 2021-02-12 DIAGNOSIS — O9081 Anemia of the puerperium: Secondary | ICD-10-CM

## 2021-02-12 LAB — CBC
HCT: 26.5 % — ABNORMAL LOW (ref 36.0–46.0)
Hemoglobin: 9.5 g/dL — ABNORMAL LOW (ref 12.0–15.0)
MCH: 33.1 pg (ref 26.0–34.0)
MCHC: 35.8 g/dL (ref 30.0–36.0)
MCV: 92.3 fL (ref 80.0–100.0)
Platelets: 161 10*3/uL (ref 150–400)
RBC: 2.87 MIL/uL — ABNORMAL LOW (ref 3.87–5.11)
RDW: 13.3 % (ref 11.5–15.5)
WBC: 11.8 10*3/uL — ABNORMAL HIGH (ref 4.0–10.5)
nRBC: 0 % (ref 0.0–0.2)

## 2021-02-12 MED ORDER — ACETAMINOPHEN 325 MG PO TABS: 650.0000 mg | ORAL_TABLET | ORAL | 0 refills | Status: DC | PRN

## 2021-02-12 MED ORDER — IBUPROFEN 600 MG PO TABS: 600.0000 mg | ORAL_TABLET | Freq: Four times a day (QID) | ORAL | 0 refills | Status: DC

## 2021-02-12 MED ORDER — POLYSACCHARIDE IRON COMPLEX 150 MG PO CAPS: 150.0000 mg | ORAL_CAPSULE | ORAL | 0 refills | Status: DC

## 2021-02-12 MED ORDER — DOCUSATE SODIUM 100 MG PO CAPS: 100.0000 mg | ORAL_CAPSULE | Freq: Two times a day (BID) | ORAL | 2 refills | Status: DC | PRN

## 2021-02-12 NOTE — Lactation Note (Signed)
This note was copied from a baby's chart. Lactation Consultation Note  Patient Name: Katrina Jacobson [id number] Date: 02/12/2021 Reason for consult: Nipple pain/trauma;Term;Difficult latch;Primapara;1st time breastfeeding Age:41 hours  LC in to room for follow up due to nipple trauma. Mother states she has been using organic nipple balm. LC encouraged using EBM to nipples prior to applying nipple balm or comfort gels. Mother request DEBP to be set up for stimulation and supplementation.  "Katrina Jacobson" has been getting donor milk. LC explained feeding volumes. Mother collected ~4 mL of EBM using 24-mm flange, initiation setting. LC spoonfed "Katrina Jacobson" and mother fed ~20 mL of donor milk. Encouraged to keep "Katrina Jacobson" upright, skin to skin, for ~15 minutes prior to swaddling and laying in basinet.    Feeding Mother's Current Feeding Choice: Breast Milk and Donor Milk  Lactation Tools Discussed/Used Tools: Pump;Flanges;Coconut oil Flange Size: 24 Breast pump type: Double-Electric Breast Pump Pump Education: Setup, frequency, and cleaning;Milk Storage Reason for Pumping: stimulation and supplementation Pumping frequency: prior to feedings with initiation setting Pumped volume: 4 mL (LC spoonfed baby)  Interventions Interventions: Breast feeding basics reviewed;Skin to skin;Expressed milk;Coconut oil;Hand pump;DEBP;Education  Discharge Discharge Education: Engorgement and breast care Pump: Personal;DEBP;Manual WIC Program: No  Consult Status Consult Status: Follow-up Date: 02/13/21 Follow-up type: In-patient    Solace Wendorff A Higuera Ancidey 02/12/2021, 11:15 PM

## 2021-02-12 NOTE — Discharge Instructions (Signed)

## 2021-02-12 NOTE — Progress Notes (Signed)
Post Partum Day 1 Subjective: no complaints and tolerating PO  Was initially planning to discharge today but baby needs to stay for another day so patient will stay till day 2  Objective: Blood pressure 110/74, pulse 61, temperature 97.9 F (36.6 C), temperature source Oral, resp. rate 17, height 5\' 10"  (1.778 m), weight 83.3 kg, last menstrual period 05/09/2020, SpO2 97 %, unknown if currently breastfeeding.  Physical Exam:  General: alert and appears stated age Lochia: appropriate Uterine Fundus: firm Incision: N/A DVT Evaluation: No evidence of DVT seen on physical exam.  Recent Labs    02/10/21 0827 02/12/21 0518  HGB 12.7 9.5*  HCT 36.4 26.5*    Assessment/Plan: PPD#1 - Doing well             Routine postpartum care,  Contraception: declines at this time Feeding: breast Dispo: Since baby staying another day, plan for discharge PPD2.     LOS: 2 days   02/14/21, MD, MPH OB Fellow, Faculty Practice

## 2021-02-12 NOTE — Lactation Note (Addendum)
This note was copied from a baby's chart. Lactation Consultation Note  Patient Name: Katrina Jacobson BWLSL'H Date: 02/12/2021 Reason for consult: Initial assessment Age:41 hours Mother is a P1, infant has been cue base feeding since birth. Mother has multiple suck bruises on her areolas and tiny open  wounds to her nipples.    Mother was given [facility] brochure and basic teaching done.  Mother reports that infant is feeding well but she has a pain scale of #6 when she first latches infant.  Reviewed hand expression with mother. Observed large drops of colostrum. Mother nipple is flat when compressed. Mother was given a harmony hand pump with instructions. She was fit with a #21 flange. Advised mother to pre-pump a few mins before latching.   Observed several feedings and infant does open wide with some help flanging top lip. Mother more comfortable with latch.  Some lingual restrictions observed. Discussed possibly using a nipple shield if unable to get less painful latch.  Mother was given comfort gels.. Mother to continue to cue base feed infant and feed at least 8-12 times or more in 24 hours and advised to allow for cluster feeding infant as needed.   Mother to continue to due STS. Mother is aware of available LC services at [facility], BFSG'S, OP Dept, and phone # for questions or concerns about breastfeeding.  Mother receptive to all teaching and plan of care.    Maternal Data    Feeding Mother's Current Feeding Choice: Breast Milk  LATCH Score Latch: Grasps breast easily, tongue down, lips flanged, rhythmical sucking.  Audible Swallowing: A few with stimulation  Type of Nipple: Everted at rest and after stimulation  Comfort (Breast/Nipple): Filling, red/small blisters or bruises, mild/mod discomfort  Hold (Positioning): Assistance needed to correctly position infant at breast and maintain latch.  LATCH Score: 7   Lactation Tools Discussed/Used Tools: Flanges;Pump Flange  Size: 21 Breast pump type: Manual Pump Education: Setup, frequency, and cleaning Reason for Pumping: pre pump to firm nipple  Interventions Interventions: Breast feeding basics reviewed;Assisted with latch;Skin to skin;Hand express;Adjust position;Support pillows;Position options;Comfort gels;Education  Discharge    Consult Status Consult Status: Follow-up Date: 02/12/21 Follow-up type: In-patient    Stevan Born [facility] 02/12/2021, 3:23 PM

## 2021-02-12 NOTE — Progress Notes (Signed)
CSW received and acknowledges consult for EDPS of 9.  Consult screened out due to 9 on EDPS does not warrant a CSW consult.  MOB whom scores are greater than 9/yes to question 10 on [place] Postpartum Depression Screen warrants a CSW consult.   Jurni Cesaro Boyd-Gilyard, MSW, LCSW Clinical Social Work [phone]  

## 2021-02-13 ENCOUNTER — Ambulatory Visit: Payer: Self-pay

## 2021-02-13 MED ORDER — POLYSACCHARIDE IRON COMPLEX 150 MG PO CAPS: 150.0000 mg | ORAL_CAPSULE | ORAL | 0 refills | Status: DC

## 2021-02-13 MED ORDER — DOCUSATE SODIUM 100 MG PO CAPS: 100.0000 mg | ORAL_CAPSULE | Freq: Two times a day (BID) | ORAL | 2 refills | Status: DC | PRN

## 2021-02-13 MED ORDER — PREPLUS 27-1 MG PO TABS: 1.0000 | ORAL_TABLET | Freq: Every day | ORAL | 13 refills | Status: DC

## 2021-02-13 MED ORDER — ACETAMINOPHEN 325 MG PO TABS: 650.0000 mg | ORAL_TABLET | ORAL | 0 refills | Status: AC | PRN

## 2021-02-13 MED ORDER — IBUPROFEN 600 MG PO TABS: 600.0000 mg | ORAL_TABLET | Freq: Four times a day (QID) | ORAL | 0 refills | Status: DC

## 2021-02-13 NOTE — Plan of Care (Signed)
Pt to be discharged with printed instructions. No concerns noted. Katrina Jacobson L Johnothan Bascomb, RN  

## 2021-02-13 NOTE — Lactation Note (Signed)
This note was copied from a baby's chart. Lactation Consultation Note  Patient Name: Katrina Jacobson WIOMB'T Date: 02/13/2021 Reason for consult: Follow-up assessment;Mother's request;Difficult latch;Infant weight loss;Nipple pain/trauma Age:41 hours  Mom attempted to latch infant with last feeding but nipples still sore. LC talked with Mom about trying a laid back position with help of RN or LC. If still too painful, take a breast rest tonight. Mom to care for her nipples with use of EBM and coconut oil for nipple care. Mom wearing breast shells not pumping, sleeping or nursing.   Mom comfort gels for pain to use rinse in between use and discard after 6 days. Mom aware to not use coconut oil with comfort gels.   Mom offering DBM for feeding. Mom aware if infant not latching at the breast she can offer more 30 ml per feeding and increase as tolerated.   All questions answered at the end of the visit.   Maternal Data    Feeding Mother's Current Feeding Choice: Breast Milk and Donor Milk  LATCH Score                    Lactation Tools Discussed/Used Tools: Flanges;Pump;Coconut oil;Comfort gels;Shells Flange Size: 24 Breast pump type: Double-Electric Breast Pump Pump Education: Setup, frequency, and cleaning;Milk Storage Reason for Pumping: every 3 hrs for 15 min Pumping frequency: every 3 hrs for 15 min  Interventions Interventions: Breast feeding basics reviewed;Education;Position options;Skin to skin;Expressed milk;Breast massage;Coconut oil;Shells;Hand express;DEBP;Breast compression;Comfort gels  Discharge    Consult Status Consult Status: Follow-up Date: 02/14/21 Follow-up type: In-patient    Dannah Ryles  Nicholson-Springer 02/13/2021, 9:56 PM

## 2021-02-13 NOTE — Lactation Note (Signed)
This note was copied from a baby's chart. Lactation Consultation Note  Patient Name: Girl Konstantina Nachreiner ZCHYI'F Date: 02/13/2021 Reason for consult: Follow-up assessment;1st time breastfeeding;Primapara;Nipple pain/trauma;Term;Infant weight loss;Other (Comment) (7 % weight loss/ per mom pumped x 2 since this am with 10 ml expressed. Both  nipples appear healthier than this morning. This consult worked on depth and positioining - few swallows and baby sleepy. it had only been 1300 ( its only been 1- 1/2 hours).) Age:77 hours  Maternal Data Has patient been taught Hand Expression?: Yes  Feeding Mother's Current Feeding Choice: Breast Milk and Donor Milk Nipple Type: Slow - flow  LATCH Score Latch: Grasps breast easily, tongue down, lips flanged, rhythmical sucking.  Audible Swallowing: A few with stimulation  Type of Nipple: Everted at rest and after stimulation  Comfort (Breast/Nipple): Filling, red/small blisters or bruises, mild/mod discomfort  Hold (Positioning): Assistance needed to correctly position infant at breast and maintain latch.  LATCH Score: 7   Lactation Tools Discussed/Used Tools: Shells;Pump;Flanges Flange Size: 24 Breast pump type: Manual;Double-Electric Breast Pump Pumping frequency: x 2 Pumped volume: 10 mL  Interventions Interventions: Breast feeding basics reviewed;Education;Shells;Comfort gels;Hand pump;DEBP;Assisted with latch;Skin to skin;Adjust position;Position options;Breast compression  Discharge    Consult Status Consult Status: Follow-up Date: 02/14/21 Follow-up type: In-patient    Matilde Sprang Chinyere Galiano 02/13/2021, 3:24 PM

## 2021-02-13 NOTE — Lactation Note (Signed)
This note was copied from a baby's chart. Lactation Consultation Note  Patient Name: Katrina Jacobson PIRJJ'O Date: 02/13/2021 Reason for consult: Follow-up assessment;Primapara;1st time breastfeeding;Nipple pain/trauma;Term;Infant weight loss;Other (Comment) (Serum Bili - 13.3,  7 % weight loss, LC reviewed and updated the doc flow sheets per mom. LC spoke with the [facility] Doctor and due to the high serum Bili D/C probably will be held.) Age:41 hours - Last wet was 1630 7/29 ( mom confirmed )  LC assessed breast tissue due mom mentioned it hurts when latching. LC noted the right nipple to be bruised and intact abrasion, the left nipple has a intact blister bruised area upper edge of the nipple. LC provided shells, and mom already has comfort gels x 6 days.  LC Plan :  For today due to soreness x 3 feedings / more if needed pump both breast around feeding times for 15 -20 mins / save milk.  After pumping use cold comfort gels until warm and then breast shells while awake with nipple butter ( mom brought from home ).  When soreness has improved - ( maybe later today )  Steps for latching - breast massage, hand express, pre - pump with hand pump ( 10 -15 strokes ) and reverse pressure as shown.  Latch with firm support. Supplement afterwards with 30 ml.  Post pump both breast for 15  mins / save the milk.      Maternal Data Has patient been taught Hand Expression?: Yes  Feeding Mother's Current Feeding Choice: Breast Milk and Donor Milk Nipple Type: (P) Slow - flow  LATCH Score                    Lactation Tools Discussed/Used Tools: Shells;Flanges;Comfort gels Flange Size: 24 Breast pump type: Manual;Double-Electric Breast Pump (pump already set up) Pump Education: Milk Storage  Interventions Interventions: Breast feeding basics reviewed;Education;Comfort gels;Shells;Hand express  Discharge Discharge Education: Engorgement and breast care Pump:  Personal;Manual;DEBP  Consult Status Consult Status: Follow-up Date: 02/13/21 Follow-up type: In-patient    Katrina Jacobson 02/13/2021, 9:36 AM

## 2021-02-14 ENCOUNTER — Ambulatory Visit: Payer: Self-pay

## 2021-02-14 NOTE — Lactation Note (Signed)
This note was copied from a baby's chart. Lactation Consultation Note  Patient Name: Katrina Jacobson AOZHY'Q Date: 02/14/2021 Reason for consult: Follow-up assessment;Mother's request;Difficult latch Age:41 days, female term infant on phototherapy due to jaundice. LC entered room infant was fussy and crying mom was attempting to BF infant. Per mom, she would like latch assistance  with latching infant at the breast, mom has not latched infant at breast past few days due to breast abrasions and nipple soreness. Mom latched infant on her right breast but infant would not sustained latch, infant has tendency to suck her  top lip inward, LC flanged top lip outward and used 5 [place] feeding tube, infant briefly sustained  latch, mom fitted with 20 mm NS, infant latched at breast for 8 minutes taking 5 mls of donor breast milk at the breast. Infant was given additional 25 mls of donor breast milk with curve tip syringe, LC did suck training with flanging top lip outward. Infant would only take  total of 30 mls with this feeding became spitty afterwards, infant was burped while receiving donor breast milk. Mom was happy that infant latched at the breast and going forward she wants to latch infant with every feeding. Mom had not been using the DEBP, mom willing to pump with LC in room, LC assisted mom with using DEBP, mom pumped 13 mls and was still pumping when LC left the room.  Mom's plan: 1- Mom will continue to BF infant according to hunger cues, 8 to 12+ times or more within 24 hours, STS, mom will not make infant wait to feed if close to 4 hours. 2- Mom will latch infant with 20 mm NS, afterwards mom will give infant her pumped EBM first using the slow flow bottle nipple and then supplement infant with donor breast milk pace feeding infant with burping. 3- Based on infant's age/ hours of life mom will try supplement infant  EBM/Donor breast milk with 35  to 45 mls per feeding or more. 4- Mom will  continue to use DEBP every 3 hours for 15 minutes on initial setting and after latching infant at breast, mom will give infant her pumped EBM before supplementing with donor breast milk.  5- Mom will ask RN or LC for further assistance with latching infant at the breast.  Maternal Data Has patient been taught Hand Expression?: Yes  Feeding Mother's Current Feeding Choice: Breast Milk and Donor Milk Nipple Type: Slow - flow  LATCH Score Latch: Repeated attempts needed to sustain latch, nipple held in mouth throughout feeding, stimulation needed to elicit sucking reflex.  Audible Swallowing: A few with stimulation  Type of Nipple: Everted at rest and after stimulation  Comfort (Breast/Nipple): Filling, red/small blisters or bruises, mild/mod discomfort (blisters and abraisons are healing)  Hold (Positioning): Assistance needed to correctly position infant at breast and maintain latch.  LATCH Score: 6   Lactation Tools Discussed/Used Tools: Shells;Pump;Nipple Shields;63F feeding tube / Syringe Nipple shield size: 20 (Infant not sustaining latch been using bottles and pacifer) Flange Size: 24 Breast pump type: Double-Electric Breast Pump Pump Education: Setup, frequency, and cleaning;Milk Storage Reason for Pumping: Mom will start pumping every 3 hours for 15 minutes on inital setting, previously mom had not been pumping was discourage due to not seeing colostrum. LC assisted mom with using DEBP and mom had pumped 13 mls and was still pumping when LC left the room. Pumping frequency: Mom will start puimping every 3 hours for 15 minutes on  inital setting. Pumped volume: 13 mL (Still pumping)  Interventions Interventions: Breast feeding basics reviewed;Skin to skin;Assisted with latch;Hand express;Breast compression;Adjust position;Support pillows;Position options;Expressed milk;DEBP;Shells;Coconut oil;Comfort gels;Education  Discharge    Consult Status Consult Status:  Follow-up Date: 02/15/21 Follow-up type: In-patient    Danelle Earthly 02/14/2021, 7:31 PM

## 2021-02-14 NOTE — Lactation Note (Signed)
This note was copied from a baby's chart. Lactation Consultation Note  Patient Name: Katrina Jacobson XTKWI'[id number] Date: 02/14/2021 Reason for consult: Follow-up assessment;Mother's request;Difficult latch;1st time breastfeeding;Hyperbilirubinemia (-6% weight loss) Age:41 days, infant had large green stool while LC was in the room. MGM had given infant 13 mls of mom's EBM by slow flow bottle nipple prior to [facility] entering the room. Infant took 10 mls of donor breast milk using 5 french feeding tube and 20 mm NS while latched on mom's left breast and BF for 8 minutes and became fussy, infant breifly latched few minutes without NS. Infant was given additional of EBM mom pumped and 3 of donor breast milk with slow flow bottle nipple as LC left the room. Infant total volume was 40 mls ( 20 mom's EBM and 20 donor breast milk with this feeding). Mom is putting infant under billi lights after each feeding. Per mom, when infant is currently latching she is not having pain with latch like previously, continue to work towards flanging infant's top lip out with latch. Mom will continue to work toward latching infant at the breast and infant sustaining latch. Mom will continue to pump every 3 hours for 15 minutes on initial setting. Continue to ask for latch assistance if needed.  Mom will continue to breastfeed infant according to cues and supplement infant after each latch intake of 30 mls + for each feeding.  Maternal Data Has patient been taught Hand Expression?: Yes  Feeding Mother's Current Feeding Choice: Breast Milk and Donor Milk  LATCH Score Latch: Grasps breast easily, tongue down, lips flanged, rhythmical sucking.  Audible Swallowing: A few with stimulation  Type of Nipple: Everted at rest and after stimulation  Comfort (Breast/Nipple): Filling, red/small blisters or bruises, mild/mod discomfort  Hold (Positioning): Assistance needed to correctly position infant at breast and maintain  latch.  LATCH Score: 7   Lactation Tools Discussed/Used Tools: Shells;Pump;Nipple Shields;62F feeding tube / Syringe Nipple shield size: 20 Flange Size: 24 Breast pump type: Double-Electric Breast Pump Pump Education: Setup, frequency, and cleaning;Milk Storage Reason for Pumping: Mom will start pumping every 3 hours for 15 minutes on inital setting, previously mom had not been pumping was discourage due to not seeing colostrum. LC assisted mom with using DEBP and mom had pumped 13 mls and was still pumping when LC left the room. Pumping frequency: Mom will start puimping every 3 hours for 15 minutes on inital setting. Pumped volume: 7 mL  Interventions Interventions: Adjust position;Support pillows;Position options;Breast compression;Expressed milk  Discharge    Consult Status Consult Status: Follow-up Date: 02/15/21 Follow-up type: In-patient    Danelle Earthly 02/14/2021, 10:09 PM

## 2021-02-15 ENCOUNTER — Ambulatory Visit: Payer: Self-pay

## 2021-02-15 NOTE — Lactation Note (Signed)
This note was copied from a baby's chart. Lactation Consultation Note  Patient Name: Katrina Jacobson [id number] Date: 02/15/2021 Reason for consult: Follow-up assessment;1st time breastfeeding;Primapara;Hyperbilirubinemia;Infant weight loss;Other (Comment) (back up to 7 % weight loss ( voids and stools correlate with weight loss ) , per Dolly Rias - Photo tx D/C this am /rebound Bili at 1400 / if WNL possible D/C. Mom pumping with the DEBP with EBM yield. LC praised her. Mom  aware to call for next feeding.) Age:41 days  Maternal Data Has patient been taught Hand Expression?: Yes  Feeding Mother's Current Feeding Choice: Breast Milk and Donor Milk  LATCH Score ( Latch Score by the [facility] )  Latch: Repeated attempts needed to sustain latch, nipple held in mouth throughout feeding, stimulation needed to elicit sucking reflex.  Audible Swallowing: A few with stimulation  Type of Nipple: Everted at rest and after stimulation  Comfort (Breast/Nipple): Filling, red/small blisters or bruises, mild/mod discomfort  Hold (Positioning): Assistance needed to correctly position infant at breast and maintain latch.  LATCH Score: 6   Lactation Tools Discussed/Used Tools: Pump Flange Size: 24 Breast pump type: Double-Electric Breast Pump Pump Education: Milk Storage  Interventions Interventions: Breast feeding basics reviewed;Education  Discharge Pump: Personal;Manual;DEBP  Consult Status Consult Status: Follow-up Date: 02/15/21 Follow-up type: In-patient    Katrina Jacobson 02/15/2021, 10:38 AM

## 2021-02-15 NOTE — Lactation Note (Signed)
This note was copied from a baby's chart. Lactation Consultation Note  Patient Name: Katrina Jacobson Date: 02/15/2021 Reason for consult: Follow-up assessment;Infant weight loss;Hyperbilirubinemia;1st time breastfeeding;Primapara;Term Age:41 days Mom called requesting LC as requested for feeding assessment.  Baby had a large wet diaper. LC placed baby STS and attempted to latch on the  Right breast and baby to fussy and had to get an appetizer of EBM from bottle and  Then latch / depth obtained / and then 5 F SNS with donor milk inserted in the side of the mouth and baby fed 15 mins and took 25 ml of EBM.  Baby settled for a few minutes and still rooting , mom finished the feeding with formula from a bottle ( baby is use to larger volumes - 61 days old )  Mom plans to post pump both breast for 15 - 20 mins / save milk for the next feeding.  LC reviewed the LC plan below and BF D/C teaching.   LC reviewed the New LC plan - and possible D/C today if the Serum Bilirubin is down WNL .  Shells between feedings except when sleeping /alternating with comfort gels.  Feed with feeding cues and by 3 hours due to 7 % weight loss and S/P high Bili.  Prepare all the BF tools needed for the feeding.  If the baby is fussy latching give an Appetizer with the bottle 10 ml and then latch,  Have grandmother insert the 5 F SNS  ( as shown ) and allow the baby to feed for a good 15 -20 mins or longer if actively sucking ( 30 ml ) .  Supplemented if needing more volume after feeding at the breast.  Post pump for 15 -20 mins / save the milk and switch to the other breast next feeding.   LC offered to request and LC O/P appt and mom receptive- placed in Epic .  Mom aware she will receive a call from the clinic .  Maternal Data Has patient been taught Hand Expression?: Yes  Feeding Mother's Current Feeding Choice: Breast Milk and Formula (11-7 donor milk not available / mom had to switch formula and  breast milk) Nipple Type: Extra Slow Flow  LATCH Score Latch: Grasps breast easily, tongue down, lips flanged, rhythmical sucking.  Audible Swallowing: Spontaneous and intermittent  Type of Nipple: Everted at rest and after stimulation  Comfort (Breast/Nipple): Filling, red/small blisters or bruises, mild/mod discomfort  Hold (Positioning): Assistance needed to correctly position infant at breast and maintain latch.  LATCH Score: 8   Lactation Tools Discussed/Used Tools: Shells;Pump;Flanges;Supplemental Nutrition System;93F feeding tube / Syringe Flange Size: 24 Breast pump type: Manual;Double-Electric Breast Pump Pump Education: Milk Storage Pumped volume: 25 mL  Interventions Interventions: Breast feeding basics reviewed;Assisted with latch;Skin to skin;Breast massage;Hand express;Breast compression;Adjust position;Support pillows;Position options;Expressed milk;Shells;DEBP;Hand pump;Education  Discharge Discharge Education: Engorgement and breast care;Warning signs for feeding baby Pump: Personal;Manual;DEBP  Consult Status Consult Status: Complete Date: 02/15/21 Follow-up type: In-patient    Katrina Jacobson 02/15/2021, 1:05 PM

## 2021-02-25 ENCOUNTER — Telehealth (HOSPITAL_COMMUNITY): Payer: Self-pay

## 2021-02-25 NOTE — Telephone Encounter (Signed)
"  We're doing good. Getting used to the lack of sleep. My mom and sister are here and they are helping me." Patient has no questions or concerns about her healing.  "She's great. She is gaining weigt. She is a good sleeper. I'm doing a combination of breast milk and formula. I'm doing some pumping. She sleeps in a bassinet next to my bed." RN reviewed ABC's of safe sleep with patient. Patient declines any questions or concerns about baby.  EPDS score is 4.  Marcelino Duster [facility] 02/25/2021,1602

## 2021-03-11 ENCOUNTER — Ambulatory Visit: Payer: Medicaid Other | Admitting: Obstetrics and Gynecology

## 2021-03-15 ENCOUNTER — Telehealth: Payer: Self-pay

## 2021-03-15 NOTE — Telephone Encounter (Signed)
Katrina Jacobson, called wanting to speak with a nurse about this patient would like a call back at the number below   [phone]

## 2021-03-17 NOTE — Telephone Encounter (Signed)
Call placed back to [facility] connects nurse. Wanted to let us know that her Inocente Salles score was 9. Denies pt is happy and doing well otherwise.  Was given [facility] urgent care information by [facility] Nurse. Will give update to Samara Deist, CNM for [facility] visit on 03/25/21.  Judeth Cornfield, RN

## 2021-03-25 ENCOUNTER — Ambulatory Visit (INDEPENDENT_AMBULATORY_CARE_PROVIDER_SITE_OTHER): Payer: Medicaid Other | Admitting: Student

## 2021-03-25 ENCOUNTER — Other Ambulatory Visit: Payer: Self-pay

## 2021-03-25 ENCOUNTER — Other Ambulatory Visit: Payer: Self-pay | Admitting: Student

## 2021-03-25 VITALS — BP 117/84 | HR 67 | Wt 162.0 lb

## 2021-03-25 DIAGNOSIS — Z1231 Encounter for screening mammogram for malignant neoplasm of breast: Secondary | ICD-10-CM | POA: Diagnosis not present

## 2021-03-25 DIAGNOSIS — O0990 Supervision of high risk pregnancy, unspecified, unspecified trimester: Secondary | ICD-10-CM

## 2021-03-25 MED ORDER — ESCITALOPRAM OXALATE 10 MG PO TABS: 10.0000 mg | ORAL_TABLET | Freq: Every day | ORAL | 1 refills | Status: AC

## 2021-03-25 NOTE — Progress Notes (Signed)
    Post Partum Visit Note  Katrina Jacobson is a 40 y.o. [phone] female who presents for a postpartum visit. She is 6 weeks postpartum following a normal spontaneous vaginal delivery.  I have fully reviewed the prenatal and intrapartum course. The delivery was at 39.5 gestational weeks.  Anesthesia: epidural. Postpartum course has been uneventful however patient reports crying and stress.  Baby's course has been complicated by colic and difficulty feeding.  Baby is feeding by bottle - Enfamil . Bleeding no bleeding. Bowel function is normal. Bladder function is normal. Patient is sexually active. Contraception method is none. Postpartum depression screening: positive.   The pregnancy intention screening data noted above was reviewed. Potential methods of contraception were discussed. The patient elected to proceed with No data recorded.    Health Maintenance Due  Topic Date Due   COVID-19 Vaccine (1) Never done   PAP SMEAR-Modifier  Never done   INFLUENZA VACCINE  Never done    The following portions of the patient's history were reviewed and updated as appropriate: allergies, current medications, past family history, past medical history, past social history, past surgical history, and problem list.  Review of Systems Pertinent items are noted in HPI.  Objective:  LMP 05/09/2020    General:  alert, cooperative, and no distress   Breasts:  normal  Lungs: clear to auscultation bilaterally  Heart:  regular rate and rhythm, S1, S2 normal, no murmur, click, rub or gallop  Abdomen: soft, non-tender; bowel sounds normal; no masses,  no organomegaly   Wound NA  GU exam:  not indicated       Assessment:    Healthy  postpartum exam.   Plan:   Essential components of care per ACOG recommendations:  1.  Mood and well being: Patient with positive depression screening today. Reviewed local resources for support. Patient declined to see jamie but she would like to start an  antidepressant. She would like to start lexapro based on what she has read about side effects.  - Patient tobacco use? No.   - hx of drug use? No.    2. Infant care and feeding:  -Patient currently breastmilk feeding? No. -Social determinants of health (SDOH) reviewed in EPIC. No concerns.. The following needs were identified. Patient would like to start lexapro  3. Sexuality, contraception and birth spacing - Patient does want a pregnancy in the next year.  Desired family size is 2 children.  - Reviewed forms of contraception in tiered fashion. Patient desired no method today.  Patient is sexually active and has not had return of menses.  - Discussed birth spacing of 18 months  4. Sleep and fatigue -Encouraged family/partner/community support of 4 hrs of uninterrupted sleep to help with mood and fatigue  5. Physical Recovery  - Discussed patients delivery and complications. She describes her labor as good. - Patient had a Vaginal, no problems at delivery. Patient had a  none  laceration. Perineal healing reviewed. Patient expressed understanding - Patient has urinary incontinence? No. - Patient is safe to resume physical and sexual activity. Patient   6.  Health Maintenance - HM due items addressed Yes - Last pap smear Patient reports that she had pap smear in [place]. Pap smear not done at today's visit.  -Breast Cancer screening indicated? Yes. Patient referred today for mammogram.   7. Chronic Disease/Pregnancy Condition follow up: None  - PCP follow up  Guy Begin, CMA [facility] [employer], [facility]

## 2021-04-05 ENCOUNTER — Telehealth: Payer: Self-pay

## 2021-04-05 NOTE — Telephone Encounter (Signed)
[facility] Nurse called to follow up on patient. She states that she left a message with after hours answering service. I explained the nature of after hours triage and how this messages are only passed on in a timely manner if they are urgent. Olegario Messier states that she left a message on 03/23/21 that pt's edinburgh score was 9. I explained that we have followed up with patient since that time.

## 2021-04-08 ENCOUNTER — Ambulatory Visit: Payer: Medicaid Other

## 2021-04-20 ENCOUNTER — Inpatient Hospital Stay: Admission: RE | Admit: 2021-04-20 | Payer: Medicaid Other | Source: Ambulatory Visit

## 2021-04-29 ENCOUNTER — Ambulatory Visit
Admission: RE | Admit: 2021-04-29 | Discharge: 2021-04-29 | Disposition: A | Discharge status: Home / Self Care | Payer: Medicaid Other | Source: Ambulatory Visit | Attending: Student | Admitting: Student

## 2021-04-29 ENCOUNTER — Other Ambulatory Visit: Payer: Self-pay

## 2021-04-29 DIAGNOSIS — Z1231 Encounter for screening mammogram for malignant neoplasm of breast: Secondary | ICD-10-CM

## 2022-07-01 IMAGING — US US MFM OB DETAIL+14 WK
1 series · 13 of 28 positions shown · non-contrast
Comparison: none

[Series 1: us mfm ob detail+14 wk · 68 acquisitions, 13 frames shown]
[im 3/68]
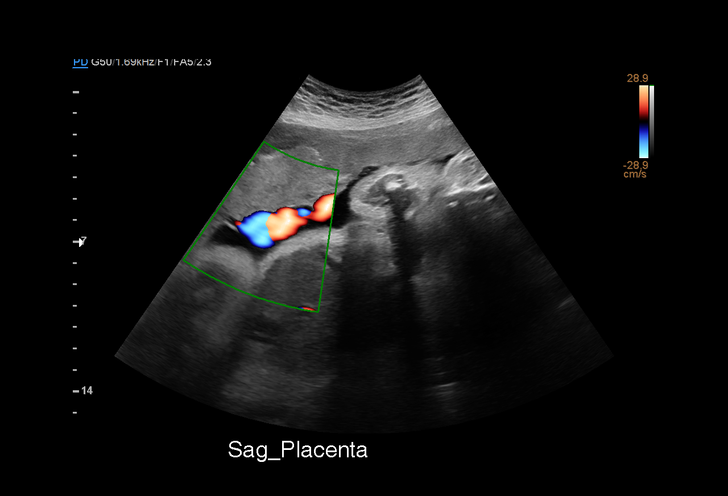
[im 8/68]
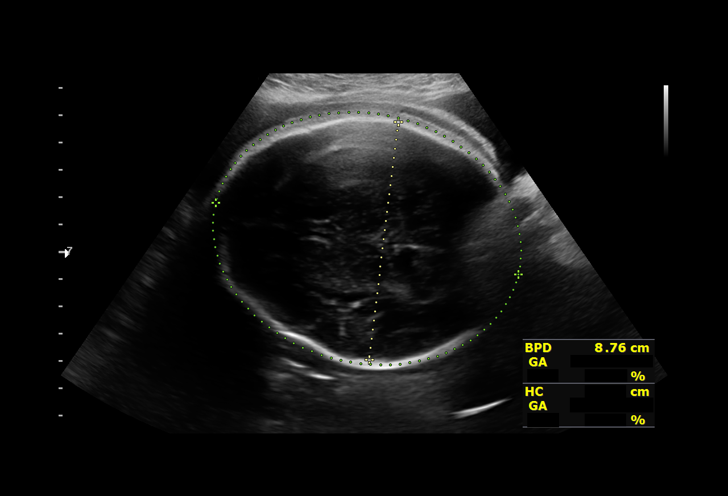
[im 13/68]
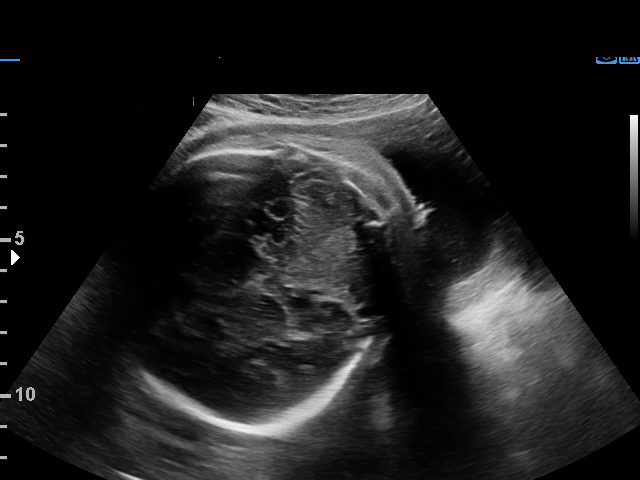
[im 18/68]
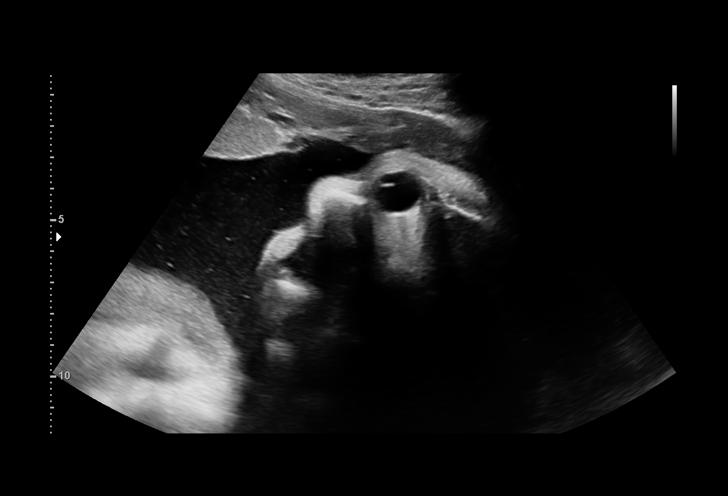
[im 23/68]
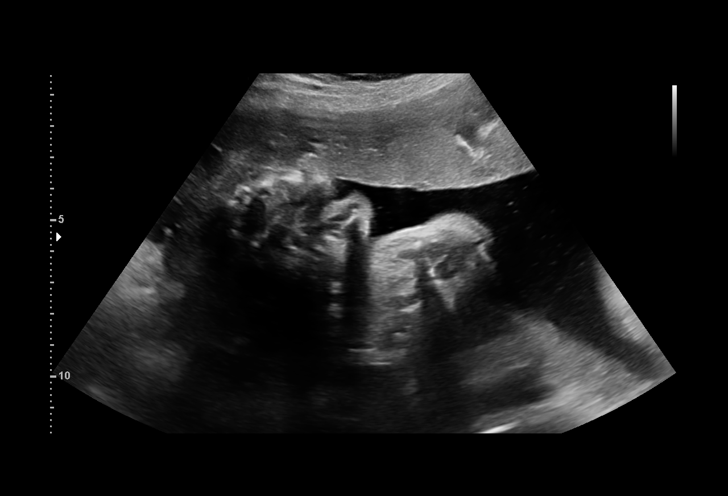
[im 28/68]
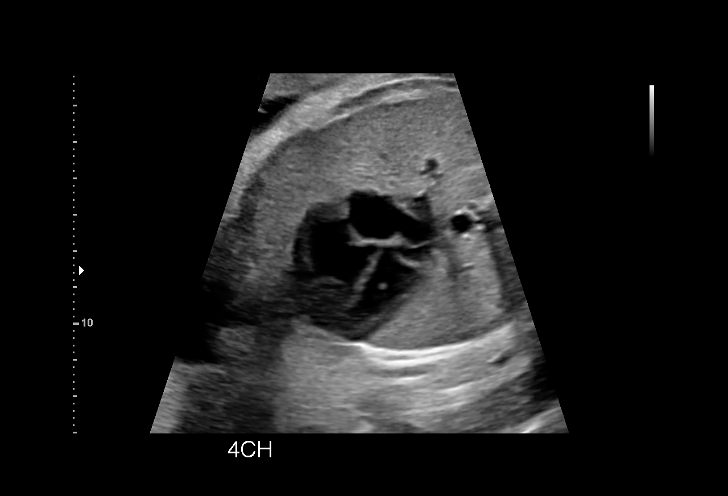
[im 35/68]
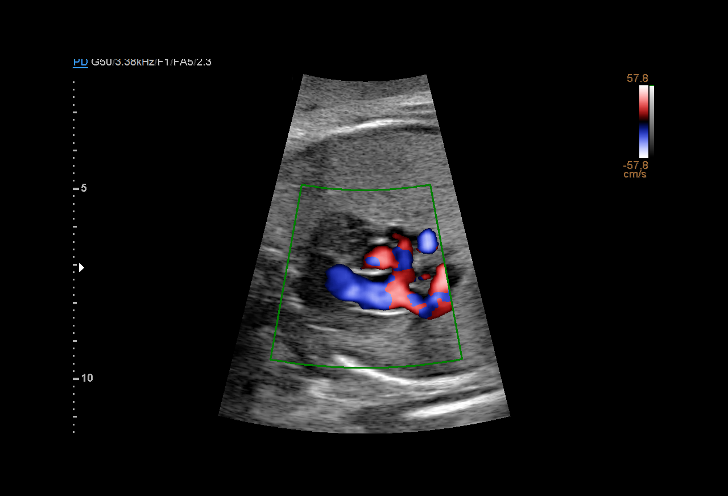
[im 40/68]
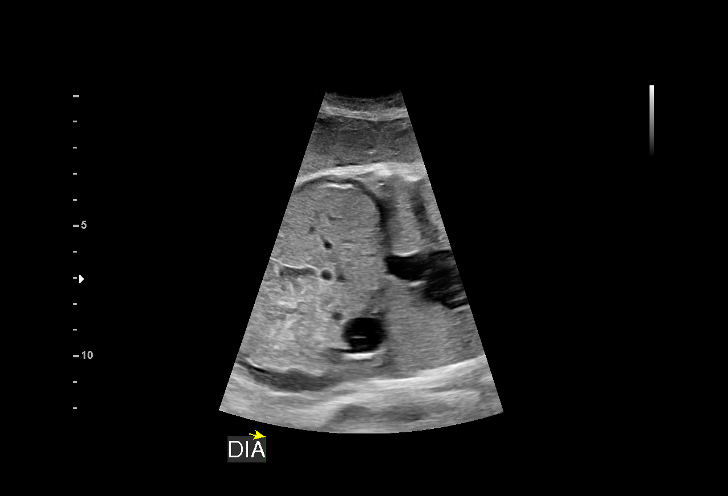
[im 45/68]
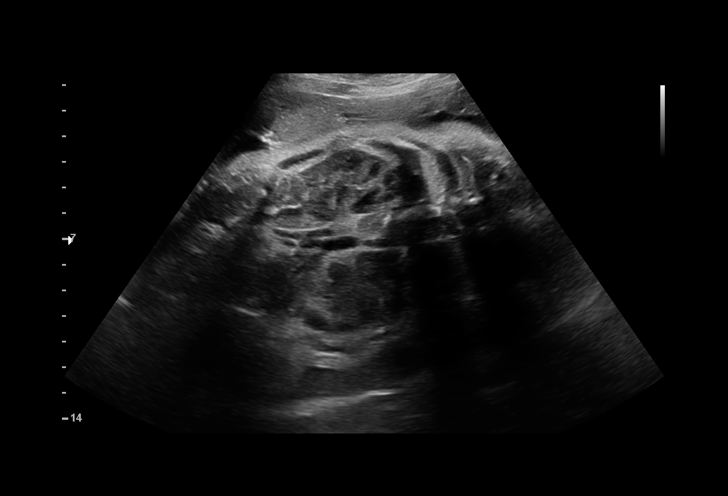
[im 50/68]
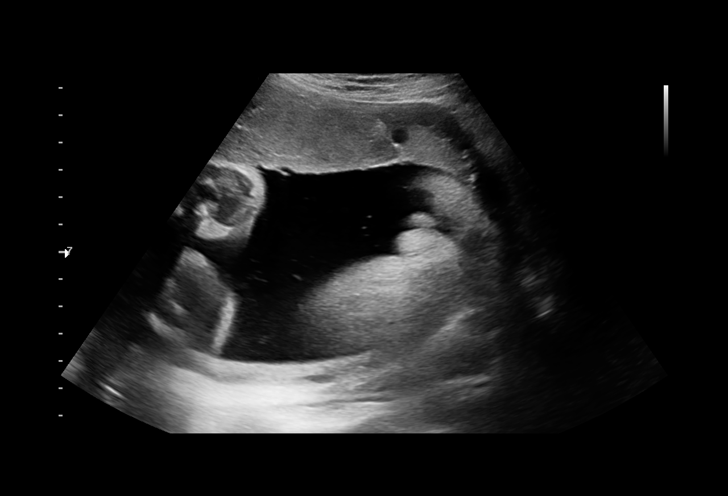
[im 55/68]
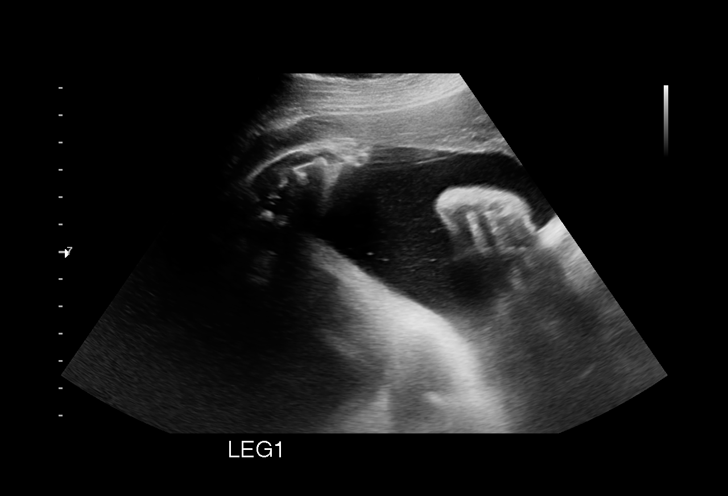
[im 60/68]
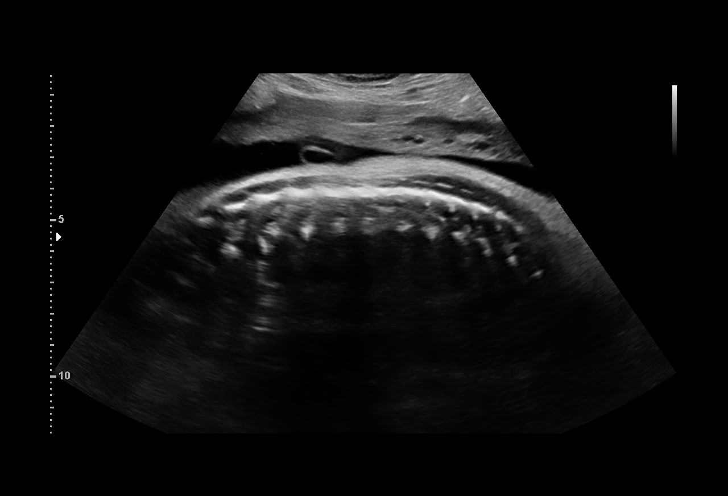
[im 65/68]
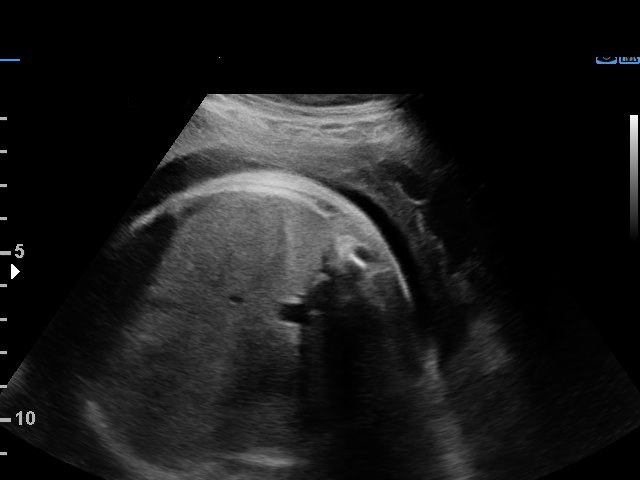

[13 of 28 positions shown; findings below may reference images not displayed]

Indications

 34 weeks gestation of pregnancy
 Encounter for antenatal screening for
 malformations
 Advanced maternal age multigravida 35+,
 third trimester
 Insufficient Prenatal Care
 Echogenic intracardiac focus of the heart
 (EIF)
Fetal Evaluation

 Num Of Fetuses:         1
 Fetal Heart Rate(bpm):  155
 Cardiac Activity:       Observed
 Presentation:           Cephalic
 Placenta:               Anterior
 P. Cord Insertion:      Visualized, central

 Amniotic Fluid
 AFI FV:      Within normal limits

 AFI Sum(cm)     %Tile       Largest Pocket(cm)
 17.87           66

 RUQ(cm)       RLQ(cm)       LUQ(cm)        LLQ(cm)

Biometry

 BPD:      88.2  mm     G. Age:  35w 5d         77  %    CI:        75.49   %    70 - 86
                                                         FL/HC:      21.5   %    20.1 -
 HC:      321.9  mm     G. Age:  36w 2d         57  %    HC/AC:      1.04        0.93 -
 AC:      309.8  mm     G. Age:  34w 6d         62  %    FL/BPD:     78.6   %    71 - 87
 FL:       69.3  mm     G. Age:  35w 4d         65  %    FL/AC:      22.4   %    20 - 24
 HUM:      61.6  mm     G. Age:  35w 5d         82  %
 CER:      49.9  mm     G. Age:  36w 5d         79  %

 LV:        3.3  mm
 CM:        5.4  mm

 Est. FW:    2711  gm    5 lb 13 oz      64  %
OB History

 Gravidity:    4          SAB:   1
 TOP:          2        Living:  0
Gestational Age

 LMP:           34w 5d        Date:  05/09/20                 EDD:   02/13/21
 U/S Today:     35w 4d                                        EDD:   02/07/21
 Best:          34w 5d     Det. By:  LMP  (05/09/20)          EDD:   02/13/21
Anatomy

 Cranium:               Appears normal         Aortic Arch:            Not well visualized
 Cavum:                 Appears normal         Ductal Arch:            Not well visualized
 Ventricles:            Appears normal         Diaphragm:              Appears normal
 Choroid Plexus:        Appears normal         Stomach:                Appears normal, left
                                                                       sided
 Cerebellum:            Appears normal         Abdomen:                Appears normal
 Posterior Fossa:       Appears normal         Abdominal Wall:         Appears nml (cord
                                                                       insert, abd wall)
 Nuchal Fold:           Not applicable (>20    Cord Vessels:           Appears normal (3
                        wks GA)                                        vessel cord)
 Face:                  Appears normal         Kidneys:                Appear normal
                        (orbits and profile)
 Lips:                  Appears normal         Bladder:                Appears normal
 Thoracic:              Appears normal         Spine:                  Appears normal
 Heart:                 Appears normal EIF     Upper Extremities:      Not well visualized
 RVOT:                  Appears normal         Lower Extremities:      Not well visualized
 LVOT:                  Appears normal

 Other:  Parents do not wish to know sex of fetus. Fetus appears to be female.
         Heels and 5th digit not well visualized. VC, 3VV and 3VTV visualized.
Cervix Uterus Adnexa

 Cervix
 Not visualized (advanced GA >97wks)

 Right Ovary
 Not visualized.

 Left Ovary
 Not visualized.
Impression

 G4 P0.  Advanced maternal age.  Patient recently moved
 from [REDACTED] where she initiated her prenatal care.  She is
 here for ultrasound evaluation.
 Patient reports she had uneventful prenatal care.  She is
 unsure of screening for fetal aneuploidies.
 On today's ultrasound, amniotic fluid is normal and good fetal
 activity seen.  Fetal growth is appropriate for gestational age.
 Fetal anatomical survey appears normal but limited by
 advanced gestational age.
 An echogenic intracardiac focus is seen.
 I counseled the patient on the finding of echogenic
 intracardiac focus, which is a marker for Down syndrome.  If
 screening for aneuploidy did not show increased risk for
 Down syndrome, this finding should be considered a normal
 variant.
 I discussed cell-free fetal DNA screening.  Patient would like
 to have cell-free fetal DNA screening.  She has an
 appointment at your office today.
 I counseled her that advanced maternal age (over 40 years)
 is associated with a small increased risk of stillbirth, and that
 we recommend weekly antenatal testing from 36 weeks
 gestation till delivery.
Recommendations

 -Appointments were made for weekly BPP from 36 weeks
 gestation.
 -It is reasonable to consider delivery at 39 weeks gestation.
                 [place], [place]

## 2024-01-24 ENCOUNTER — Other Ambulatory Visit: Payer: Self-pay

## 2024-01-24 ENCOUNTER — Inpatient Hospital Stay (HOSPITAL_BASED_OUTPATIENT_CLINIC_OR_DEPARTMENT_OTHER)
Admission: EM | Admit: 2024-01-24 | Discharge: 2024-01-27 | DRG: 442 | Disposition: A | Discharge status: Home / Self Care | Payer: Self-pay | Attending: Internal Medicine | Admitting: Internal Medicine

## 2024-01-24 ENCOUNTER — Encounter (HOSPITAL_BASED_OUTPATIENT_CLINIC_OR_DEPARTMENT_OTHER): Payer: Self-pay

## 2024-01-24 DIAGNOSIS — E871 Hypo-osmolality and hyponatremia: Secondary | ICD-10-CM | POA: Diagnosis present

## 2024-01-24 DIAGNOSIS — Z79899 Other long term (current) drug therapy: Secondary | ICD-10-CM

## 2024-01-24 DIAGNOSIS — Z91040 Latex allergy status: Secondary | ICD-10-CM

## 2024-01-24 DIAGNOSIS — D72819 Decreased white blood cell count, unspecified: Secondary | ICD-10-CM | POA: Diagnosis present

## 2024-01-24 DIAGNOSIS — R748 Abnormal levels of other serum enzymes: Principal | ICD-10-CM

## 2024-01-24 DIAGNOSIS — D649 Anemia, unspecified: Secondary | ICD-10-CM | POA: Diagnosis present

## 2024-01-24 DIAGNOSIS — B159 Hepatitis A without hepatic coma: Principal | ICD-10-CM | POA: Diagnosis present

## 2024-01-24 DIAGNOSIS — F32A Depression, unspecified: Secondary | ICD-10-CM | POA: Diagnosis present

## 2024-01-24 DIAGNOSIS — R7401 Elevation of levels of liver transaminase levels: Secondary | ICD-10-CM | POA: Diagnosis present

## 2024-01-24 DIAGNOSIS — K824 Cholesterolosis of gallbladder: Secondary | ICD-10-CM | POA: Diagnosis present

## 2024-01-24 DIAGNOSIS — Z882 Allergy status to sulfonamides status: Secondary | ICD-10-CM

## 2024-01-24 DIAGNOSIS — E878 Other disorders of electrolyte and fluid balance, not elsewhere classified: Secondary | ICD-10-CM | POA: Diagnosis present

## 2024-01-24 MED ORDER — ONDANSETRON 4 MG PO TBDP
8.0000 mg | ORAL_TABLET | Freq: Once | ORAL | Status: AC
  Administered 2024-01-25: 8 mg via ORAL
  Filled 2024-01-24: qty 2

## 2024-01-24 NOTE — ED Provider Notes (Signed)
 [facility] AT [facility] Provider Note   CSN: [id number] Arrival date & time: 01/24/24  2341     Patient presents with: Nausea   Katrina Jacobson is a 43 y.o. female.   43 year old female presents ER today with multiple symptoms.  Patient states that she has lived abroad for about 10 years.  For the last year she lived in [place].  She drinks weekly where she will binge drink on the weekends maybe a couple glasses during the week but generally just on the weekends.  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  She also states that she had some type of diarrheal illness at the beginning of her stay in [place].  She is been back in [place]  for the last month, flying in on June 7.  She states over the last week she has had what started as nausea and nighttime sweats.  She states that this has been progressively worsening where she has had vomiting and decreased appetite.  She has had a Tmax of 101.  No abdominal pain, thinks her urine might be dark.  She is very thirsty at night.  No chest pain, cough, shortness of breath.  No other drugs or tobacco.  She is on Lexapro  however has not really taken it over the last week.  She states that this does not feel like when she had dengue in the past.  She states that her nanny had hepatitis A recently.  This is the [place] in [place] not here.  She is only had a couple alcoholic drinks since being here.  No skin color changes.  She has generalized weakness and cramping as well.  A little bit of lightheadedness.        Prior to Admission medications   Medication Sig Start Date End Date Taking? Authorizing Provider  escitalopram  (LEXAPRO ) 10 MG tablet Take 1 tablet (10 mg total) by mouth daily. 03/25/21  Yes Kooistra, Kathryn Lorraine, CNM  ibuprofen  (ADVIL ) 200 MG tablet Take 200-800 mg by mouth every 6 (six) hours as needed for moderate pain (pain score 4-6).   Yes [provider]    Allergies: Sulfa antibiotics and Latex    Review of Systems  Updated Vital Signs BP 111/76 (BP Location: Right Arm)   Pulse 65   Temp 98.9 F (37.2 C)   Resp 18   Ht 5' 9 (1.753 m)   Wt 74.8 kg   LMP 01/15/2024 (Exact Date)   SpO2 98%   BMI 24.37 kg/m   Physical Exam Vitals and nursing note reviewed.  Constitutional:      General: She is not in acute distress.    Appearance: She is well-developed. She is ill-appearing. She is not toxic-appearing or diaphoretic.  HENT:     Head: Normocephalic and atraumatic.     Right Ear: Tympanic membrane normal.     Left Ear: Tympanic membrane normal.     Nose: No congestion or rhinorrhea.     Mouth/Throat:     Mouth: Mucous membranes are dry.     Pharynx: No oropharyngeal exudate or posterior oropharyngeal erythema.  Eyes:     Conjunctiva/sclera: Conjunctivae normal.     Pupils: Pupils are equal, round, and reactive to light.  Cardiovascular:     Rate and Rhythm: Normal rate and regular rhythm.  Pulmonary:     Effort: No respiratory distress.     Breath sounds: No stridor.  Abdominal:     General: There is no distension.     Tenderness: There is no abdominal tenderness.  Musculoskeletal:        General: No swelling or tenderness. Normal range of motion.     Cervical back: Normal range of motion.  Skin:    General: Skin is warm and dry.  Neurological:     General: No focal deficit present.     Mental Status: She is alert.     (all labs ordered are listed, but only abnormal results are displayed) Labs Reviewed  CBC WITH DIFFERENTIAL/PLATELET - Abnormal; Notable for the following components:      Result Value   WBC 3.4 (*)    RBC 3.78 (*)    Hemoglobin 11.8 (*)    HCT 33.5 (*)    Neutro Abs 1.5 (*)    All other components within normal limits  BASIC METABOLIC PANEL WITH GFR - Abnormal; Notable for the following components:   Sodium 125 (*)    Chloride 89 (*)    BUN <5 (*)    Calcium  8.7 (*)     All other components within normal limits  URINALYSIS, ROUTINE W REFLEX MICROSCOPIC - Abnormal; Notable for the following components:   Specific Gravity, Urine <1.005 (*)    All other components within normal limits  HEPATIC FUNCTION PANEL - Abnormal; Notable for the following components:   AST 1,607 (*)    ALT 1,995 (*)    Alkaline Phosphatase 159 (*)    Total Bilirubin 2.0 (*)    Bilirubin, Direct 1.4 (*)    All other components within normal limits  HEPATITIS PANEL, ACUTE - Abnormal; Notable for the following components:   Hep A IgM Reactive (*)    All other components within normal limits  OSMOLALITY - Abnormal; Notable for the following components:   Osmolality 262 (*)    All other components within normal limits  OSMOLALITY, URINE - Abnormal; Notable for the following components:   Osmolality, Ur 56 (*)    All other components within normal limits  COMPREHENSIVE METABOLIC PANEL WITH GFR - Abnormal; Notable for the following components:   Sodium 131 (*)    BUN <5 (*)    Calcium  8.5 (*)    Albumin 3.3 (*)    AST 1,559 (*)    ALT 1,798 (*)    Alkaline Phosphatase 131 (*)    Total Bilirubin 2.8 (*)    All other components within normal limits  BASIC METABOLIC PANEL WITH GFR - Abnormal; Notable for the following components:   CO2 21 (*)    Glucose, Bld 141 (*)    BUN <5 (*)    Calcium  8.8 (*)    All other components within normal limits  CULTURE, BLOOD (SINGLE)  LIPASE, BLOOD  PROTIME-INR  LACTIC ACID, PLASMA  TECHNOLOGIST SMEAR REVIEW  SODIUM, URINE, RANDOM  HIV ANTIBODY (ROUTINE TESTING W REFLEX)  CK  PLASMODIUM SP. PCR  MISC [facility] TEST (SEND OUT)  COMPREHENSIVE METABOLIC PANEL WITH GFR  CBC    EKG: None  Radiology: US  Abdomen Limited RUQ (LIVER/GB) Result Date: 01/25/2024 CLINICAL DATA:  [id number].  Elevated liver enzymes. EXAM: ULTRASOUND ABDOMEN LIMITED RIGHT UPPER QUADRANT COMPARISON:  None. FINDINGS: Gallbladder: The gallbladder is nearly completely  empty, contracted and with exaggerated wall thickness of 2.8 mm. No stones or pericholecystic fluid are identified and no positive sonographic Murphy sign. There is a 4.4 mm echogenic nonshadowing wall polyp in the proximal gallbladder. No other focal  luminal abnormality. Common bile duct: Diameter: 2.4 mm.  No intrahepatic biliary dilatation. Liver: No focal lesion identified. Within normal limits in parenchymal echogenicity. Portal vein is patent on color Doppler imaging with normal direction of blood flow towards the liver. Other: None. IMPRESSION: 1. Nearly completely empty gallbladder with exaggerated wall thickness of 2.8 mm. No stones or pericholecystic fluid are identified and no positive sonographic Murphy sign. 2. 4.4 mm echogenic nonshadowing wall polyp in the proximal gallbladder. 3. No other focal abnormality. Electronically Signed   By: Francis Quam M.D.   On: 01/25/2024 05:26     .Critical Care  Performed by: Lorette Mayo, MD Authorized by: Lorette Mayo, MD   Critical care provider statement:    Critical care time (minutes):  30   Critical care was necessary to treat or prevent imminent or life-threatening deterioration of the following conditions:  Endocrine crisis and metabolic crisis   Critical care was time spent personally by me on the following activities:  Development of treatment plan with patient or surrogate, discussions with consultants, evaluation of patient's response to treatment, examination of patient, ordering and review of laboratory studies, ordering and review of radiographic studies, ordering and performing treatments and interventions, pulse oximetry, re-evaluation of patient's condition and review of old charts    Medications Ordered in the ED  enoxaparin  (LOVENOX ) injection 40 mg (40 mg Subcutaneous Patient Refused/Not Given 01/25/24 0828)  sodium chloride  flush (NS) 0.9 % injection 3 mL (3 mLs Intravenous Given 01/25/24 0830)  ondansetron  (ZOFRAN ) tablet 4  mg ( Oral See Alternative 01/25/24 1505)    Or  ondansetron  (ZOFRAN ) injection 4 mg (4 mg Intravenous Given 01/25/24 1505)  albuterol  (PROVENTIL ) (2.5 MG/3ML) 0.083% nebulizer solution 2.5 mg (has no administration in time range)  ondansetron  (ZOFRAN -ODT) disintegrating tablet 8 mg (8 mg Oral Given 01/25/24 0002)  sodium chloride  0.9 % bolus 1,000 mL (0 mLs Intravenous Stopped 01/25/24 0558)  0.9 %  sodium chloride  infusion ( Intravenous New Bag/Given 01/25/24 0319)  calcium  gluconate 1 g/ 50 mL sodium chloride  IVPB (1,000 mg Intravenous New Bag/Given 01/25/24 0827)  0.9 %  sodium chloride  infusion ( Intravenous New Bag/Given 01/25/24 9177)                                    Medical Decision Making Amount and/or Complexity of Data Reviewed Labs: ordered. Radiology: ordered.  Risk Prescription drug management. Decision regarding hospitalization.     Overall patient appears well but does look like she does not feel well.  Vital signs are within normal limits.  Her labs in up showing that she had significant elevated liver enzymes, hyponatremia, leukopenia, anemia.  These can be consistent with possible malaria although she is a little bit outside the window for that.  Also could be consistent with dengue but I would expect the symptoms to couple weeks ago rather than now but it can be variable with a second exposure.  Also consider possible hepatitis.  She does not have any abdominal pain to suggest cholecystitis or acute liver failure.  Considered neoplastic process as she has a family history although seems a little bit less likely based on her lab results but will get a smear.  Treated symptomatically initially and discussed with Dr. Fleeta Rothman with infectious disease.  He thought that this was more consistent with an acute viral infection but agreed with checking Plasmodium and hepatitis panel but also asked  for a smear.  Secondary to hyponatremia and significant elevated liver enzymes and further  workup needed discussed with hospitalist for admission.  Ordered the labs that Dr. Lindia requested to be done over at [facility] and then an ultrasound as well.  Patient midi without further issues.     Final diagnoses:  Elevated liver enzymes  Hyponatremia  Leukopenia, unspecified type  Anemia, unspecified type    ED Discharge Orders     None          Jodye Scali, Selinda, MD 01/26/24 [phone]

## 2024-01-24 NOTE — ED Triage Notes (Addendum)
 Pt states she has been on Lexapro  10mg  since Feb.  Decided to taper off of it, but not consistently.  Reports nausea, sweats, vertigo symptoms and fatigued Pt is visiting here from [place] Did take her reg dose of lexapro  tonight

## 2024-01-25 ENCOUNTER — Emergency Department (HOSPITAL_BASED_OUTPATIENT_CLINIC_OR_DEPARTMENT_OTHER): Payer: Self-pay

## 2024-01-25 DIAGNOSIS — F32A Depression, unspecified: Secondary | ICD-10-CM

## 2024-01-25 DIAGNOSIS — E871 Hypo-osmolality and hyponatremia: Secondary | ICD-10-CM

## 2024-01-25 DIAGNOSIS — K824 Cholesterolosis of gallbladder: Secondary | ICD-10-CM | POA: Diagnosis present

## 2024-01-25 DIAGNOSIS — D649 Anemia, unspecified: Secondary | ICD-10-CM

## 2024-01-25 DIAGNOSIS — D72819 Decreased white blood cell count, unspecified: Secondary | ICD-10-CM

## 2024-01-25 DIAGNOSIS — E878 Other disorders of electrolyte and fluid balance, not elsewhere classified: Secondary | ICD-10-CM

## 2024-01-25 DIAGNOSIS — B159 Hepatitis A without hepatic coma: Secondary | ICD-10-CM | POA: Diagnosis present

## 2024-01-25 DIAGNOSIS — R7401 Elevation of levels of liver transaminase levels: Secondary | ICD-10-CM

## 2024-01-25 LAB — CBC WITH DIFFERENTIAL/PLATELET
Abs Immature Granulocytes: 0.01 K/uL (ref 0.00–0.07)
Basophils Absolute: 0 K/uL (ref 0.0–0.1)
Basophils Relative: 1 %
Eosinophils Absolute: 0.1 K/uL (ref 0.0–0.5)
Eosinophils Relative: 4 %
HCT: 33.5 % — ABNORMAL LOW (ref 36.0–46.0)
Hemoglobin: 11.8 g/dL — ABNORMAL LOW (ref 12.0–15.0)
Immature Granulocytes: 0 %
Lymphocytes Relative: 42 %
Lymphs Abs: 1.4 K/uL (ref 0.7–4.0)
MCH: 31.2 pg (ref 26.0–34.0)
MCHC: 35.2 g/dL (ref 30.0–36.0)
MCV: 88.6 fL (ref 80.0–100.0)
Monocytes Absolute: 0.4 K/uL (ref 0.1–1.0)
Monocytes Relative: 11 %
Neutro Abs: 1.5 K/uL — ABNORMAL LOW (ref 1.7–7.7)
Neutrophils Relative %: 42 %
Platelets: 169 K/uL (ref 150–400)
RBC: 3.78 MIL/uL — ABNORMAL LOW (ref 3.87–5.11)
RDW: 12 % (ref 11.5–15.5)
WBC: 3.4 K/uL — ABNORMAL LOW (ref 4.0–10.5)
nRBC: 0 % (ref 0.0–0.2)

## 2024-01-25 LAB — BASIC METABOLIC PANEL WITH GFR
Anion gap: 10 (ref 5–15)
Anion gap: 10 (ref 5–15)
BUN: <5 mg/dL — ABNORMAL LOW (ref 6–20)
BUN: <5 mg/dL — ABNORMAL LOW (ref 6–20)
CO2: 21 mmol/L — ABNORMAL LOW (ref 22–32)
CO2: 26 mmol/L (ref 22–32)
Calcium: 8.7 mg/dL — ABNORMAL LOW (ref 8.9–10.3)
Calcium: 8.8 mg/dL — ABNORMAL LOW (ref 8.9–10.3)
Chloride: 104 mmol/L (ref 98–111)
Chloride: 89 mmol/L — ABNORMAL LOW (ref 98–111)
Creatinine, Ser: 0.48 mg/dL (ref 0.44–1.00)
Creatinine, Ser: 0.53 mg/dL (ref 0.44–1.00)
GFR, Estimated: >60 mL/min (ref >60)
GFR, Estimated: >60 mL/min (ref >60)
Glucose, Bld: 141 mg/dL — ABNORMAL HIGH (ref 70–99)
Glucose, Bld: 98 mg/dL (ref 70–99)
Potassium: 3.7 mmol/L (ref 3.5–5.1)
Potassium: 3.8 mmol/L (ref 3.5–5.1)
Sodium: 125 mmol/L — ABNORMAL LOW (ref 135–145)
Sodium: 135 mmol/L (ref 135–145)

## 2024-01-25 LAB — TECHNOLOGIST SMEAR REVIEW: WBC MORPHOLOGY: REACTIVE

## 2024-01-25 LAB — LIPASE, BLOOD: Lipase: 47 U/L (ref 11–51)

## 2024-01-25 LAB — PROTIME-INR
INR: 1 (ref 0.8–1.2)
Prothrombin Time: 14 s (ref 11.4–15.2)

## 2024-01-25 LAB — HEPATIC FUNCTION PANEL
ALT: 1995 U/L — ABNORMAL HIGH (ref 0–44)
AST: 1607 U/L — ABNORMAL HIGH (ref 15–41)
Albumin: 3.8 g/dL (ref 3.5–5.0)
Alkaline Phosphatase: 159 U/L — ABNORMAL HIGH (ref 38–126)
Bilirubin, Direct: 1.4 mg/dL — ABNORMAL HIGH (ref 0.0–0.2)
Indirect Bilirubin: 0.7 mg/dL (ref 0.3–0.9)
Total Bilirubin: 2 mg/dL — ABNORMAL HIGH (ref 0.0–1.2)
Total Protein: 7 g/dL (ref 6.5–8.1)

## 2024-01-25 LAB — COMPREHENSIVE METABOLIC PANEL WITH GFR
ALT: 1798 U/L — ABNORMAL HIGH (ref 0–44)
AST: 1559 U/L — ABNORMAL HIGH (ref 15–41)
Albumin: 3.3 g/dL — ABNORMAL LOW (ref 3.5–5.0)
Alkaline Phosphatase: 131 U/L — ABNORMAL HIGH (ref 38–126)
Anion gap: 9 (ref 5–15)
BUN: <5 mg/dL — ABNORMAL LOW (ref 6–20)
CO2: 24 mmol/L (ref 22–32)
Calcium: 8.5 mg/dL — ABNORMAL LOW (ref 8.9–10.3)
Chloride: 98 mmol/L (ref 98–111)
Creatinine, Ser: 0.53 mg/dL (ref 0.44–1.00)
GFR, Estimated: >60 mL/min (ref >60)
Glucose, Bld: 88 mg/dL (ref 70–99)
Potassium: 3.8 mmol/L (ref 3.5–5.1)
Sodium: 131 mmol/L — ABNORMAL LOW (ref 135–145)
Total Bilirubin: 2.8 mg/dL — ABNORMAL HIGH (ref 0.0–1.2)
Total Protein: 6.8 g/dL (ref 6.5–8.1)

## 2024-01-25 LAB — CK: Total CK: 45 U/L (ref 38–234)

## 2024-01-25 LAB — URINALYSIS, ROUTINE W REFLEX MICROSCOPIC
Bilirubin Urine: NEGATIVE
Glucose, UA: NEGATIVE mg/dL
Hgb urine dipstick: NEGATIVE
Ketones, ur: NEGATIVE mg/dL
Leukocytes,Ua: NEGATIVE
Nitrite: NEGATIVE
Protein, ur: NEGATIVE mg/dL
Specific Gravity, Urine: <1.005 — ABNORMAL LOW (ref 1.005–1.030)
pH: 7.5 (ref 5.0–8.0)

## 2024-01-25 LAB — OSMOLALITY: Osmolality: 262 mosm/kg — ABNORMAL LOW (ref 275–295)

## 2024-01-25 LAB — SODIUM, URINE, RANDOM: Sodium, Ur: 16 mmol/L

## 2024-01-25 LAB — OSMOLALITY, URINE: Osmolality, Ur: 56 mosm/kg — ABNORMAL LOW (ref 300–900)

## 2024-01-25 LAB — HIV ANTIBODY (ROUTINE TESTING W REFLEX): HIV Screen 4th Generation wRfx: NONREACTIVE

## 2024-01-25 LAB — LACTIC ACID, PLASMA: Lactic Acid, Venous: 0.6 mmol/L (ref 0.5–1.9)

## 2024-01-25 MED ORDER — ONDANSETRON HCL 4 MG/2ML IJ SOLN
4.0000 mg | Freq: Four times a day (QID) | INTRAMUSCULAR | Status: DC | PRN
  Administered 2024-01-25: 4 mg via INTRAVENOUS
  Filled 2024-01-25: qty 2

## 2024-01-25 MED ORDER — SODIUM CHLORIDE 0.9 % IV SOLN: Freq: Once | INTRAVENOUS | Status: AC

## 2024-01-25 MED ORDER — SODIUM CHLORIDE 0.9% FLUSH
3.0000 mL | Freq: Two times a day (BID) | INTRAVENOUS | Status: DC
  Administered 2024-01-25 – 2024-01-26 (×2): 3 mL via INTRAVENOUS

## 2024-01-25 MED ORDER — ENOXAPARIN SODIUM 40 MG/0.4ML IJ SOSY
40.0000 mg | PREFILLED_SYRINGE | Freq: Every day | INTRAMUSCULAR | Status: DC
  Filled 2024-01-25: qty 0.4

## 2024-01-25 MED ORDER — SODIUM CHLORIDE 0.9 % IV SOLN: Freq: Once | INTRAVENOUS | Status: DC

## 2024-01-25 MED ORDER — SODIUM CHLORIDE 0.9 % IV BOLUS
1000.0000 mL | Freq: Once | INTRAVENOUS | Status: AC
  Administered 2024-01-25: 1000 mL via INTRAVENOUS

## 2024-01-25 MED ORDER — ALBUTEROL SULFATE (2.5 MG/3ML) 0.083% IN NEBU: 2.5000 mg | INHALATION_SOLUTION | Freq: Four times a day (QID) | RESPIRATORY_TRACT | Status: DC | PRN

## 2024-01-25 MED ORDER — CALCIUM GLUCONATE-NACL 1-0.675 GM/50ML-% IV SOLN
1.0000 g | Freq: Once | INTRAVENOUS | Status: AC
  Administered 2024-01-25: 1000 mg via INTRAVENOUS
  Filled 2024-01-25: qty 50

## 2024-01-25 MED ORDER — ONDANSETRON HCL 4 MG PO TABS
4.0000 mg | ORAL_TABLET | Freq: Four times a day (QID) | ORAL | Status: DC | PRN
  Administered 2024-01-26: 4 mg via ORAL
  Filled 2024-01-25: qty 1

## 2024-01-25 NOTE — Progress Notes (Signed)
 Hospitalist Transfer Note:    Nursing staff, Please call [facility] Admits & Consults System-Wide number on Amion [phone]) as soon as patient's arrival, so appropriate admitting provider can evaluate the pt.   Transferring facility: [facility] Requesting provider: Dr. Lorette (EDP at [facility]) Reason for transfer: admission for further evaluation and management of acute hyponatremia, transaminitis.     32 F  w/ history of depression on Lexapro , who presented to [facility] ED complaining of nausea. She has spent extended periods of time in [place] and recently [place]  about 1 month ago. Over the last week, starting on 7/3, the patient also reports development of cyclical fevers on a near nightly basis over the last week, reporting a temperature max of 101 at home over that timeframe.  This has been associated with night sweats as well as generalized weakness and generalized myalgias.  Aside from the generalized myalgias, she denies any additional acute discomfort, including no abdominal pain.  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 Most recent dose of her Lexapro  occurred about 1 week ago.  Vital signs in the ED were notable for the following: Afebrile; heart rates in the 60s to 80s; systolic blood pressures in the 120s to 140s, respiratory rate 15-20, and oxygen saturation 98 to 100% on room air.  Labs were notable for CMP which demonstrated sodium level of 125, along with transaminitis, including AST 1600, ALT of nearly 2000, alkaline phosphatase 159, total bilirubin 2.0, with direct predominance.  CBC notable for white cell count 3300.   Imaging notable for the following: EDP has placed order for RUQ US .   EDP at [facility] d/w on-call ID physician, Dr. Fleeta Rothman, who felt that presentation was less suggestive of dengue fever, but recommended  assessment for acute viral hepatitis, peripheral smear, assessment for malaria.  These labs are currently pending, as well as PTT, INR. ID is available, as needed, for additional consultation.   Per my discussions with EDP, we will also check serum/urine osmolality as well as random urine sodium to further evaluate presenting acute hyponatremia.  Subsequently, I accepted this patient for transfer for inpatient admission to a med-tele bed at [facility] for further work-up and management of the above.       Eva Pore, DO Hospitalist

## 2024-01-25 NOTE — ED Notes (Signed)
-  Called [facility] for transportation. 

## 2024-01-25 NOTE — ED Notes (Signed)
 ED Provider at bedside.

## 2024-01-25 NOTE — H&P (Signed)
 History and Physical    Patient: Katrina Jacobson FMW:[id number] DOB: May 21, 1981 DOA: 01/24/2024 DOS: the patient was seen and examined on 01/25/2024 PCP: Pcp, No  Patient coming from: Transferred from [facility]  Chief Complaint:  Chief Complaint  Patient presents with   Nausea   HPI: Katrina Jacobson is a 43 y.o. female with medical history significant of depression presents with fever, night sweats, and nausea.   She has experienced a low-grade fever ranging from 99 to 101 degrees Fahrenheit over the past week, accompanied by night sweats and severe chills. She describes sweating through the sheets and feeling extremely cold. These symptoms have persisted for approximately six days.  She reports nausea and a sensation of poor digestion when eating, although she has not experienced vomiting or diarrhea. She mentions feeling very thirsty despite drinking water regularly. On July 1st, she experienced vomiting after consuming three alcoholic beverages, which she notes is unusual for her as she typically tolerates alcohol well.  She describes her alcohol consumption as binge drinking once a week, often consuming more than ten drinks, and does not necessarily consider her self an alcoholic. She recently discontinued Lexapro , which she had been taking at a dose of 10 mg, and attempted to wean herself off by reducing the dose, but over the past five or six days she has not taken any Lexapro .  She mentions a history of self-diagnosed dengue fever last summer after returning from [place], characterized by a rash, high fever, pressure behind the eyes, and severe fatigue. She has not had a medical test to confirm this diagnosis. She has been living in [place] for the past year and reports exposure to potentially contaminated water sources, although she has not experienced any gastrointestinal symptoms like diarrhea.  Her nanny had hepatitis A in January, and she has been in contact with the  nanny. She is unsure if all family members were vaccinated against hepatitis A.  Upon admission into the emergency department patient was noted to be afebrile with mild tachypnea and vital signs otherwise relatively maintained.  Labs were notable for CMP which demonstrated sodium level of 125, along with transaminitis, including AST 1607, ALT 1995, alkaline phosphatase 159, total bilirubin 2.0 with direct predominance, and WBC 3.4. EDP at [facility] d/w on-call ID physician, Dr. Fleeta Rothman, who felt that presentation was less suggestive of dengue fever, but recommended assessment for acute viral hepatitis, peripheral smear, assessment for malaria.  Blood cultures were obtained.  Patient was given Zofran  and started on IV fluids.   Review of Systems: As mentioned in the history of present illness. All other systems reviewed and are negative. Past Medical History:  Diagnosis Date   Medical history non-contributory    Past Surgical History:  Procedure Laterality Date   NO PAST SURGERIES     Social History:  reports that she has never smoked. She has never used smokeless tobacco. She reports current alcohol use. She reports that she does not use drugs.  Allergies  Allergen Reactions   Sulfa Antibiotics    Latex Itching and Rash    Family History  Problem Relation Age of Onset   Hypertension Mother    Hypertension Father    Breast cancer Maternal Grandmother     Prior to Admission medications   Medication Sig Start Date End Date Taking? Authorizing Provider  docusate sodium  (COLACE) 100 MG capsule Take 1 capsule (100 mg total) by mouth 2 (two) times daily as needed. 02/13/21   Walker, Jamilla R,  CNM  escitalopram  (LEXAPRO ) 10 MG tablet Take 1 tablet (10 mg total) by mouth daily. 03/25/21   Kooistra, Kathryn Lorraine, CNM  ibuprofen  (ADVIL ) 600 MG tablet Take 1 tablet (600 mg total) by mouth every 6 (six) hours. 02/13/21   Vannie Cornell SAUNDERS, CNM  iron  polysaccharides (NIFEREX) 150 MG capsule Take 1  capsule (150 mg total) by mouth every other day. 02/13/21 03/15/21  Vannie Cornell SAUNDERS, CNM  Prenatal Vit-Fe Fumarate-FA (PREPLUS) 27-1 MG TABS Take 1 tablet by mouth daily. 02/13/21   Vannie Cornell SAUNDERS, CNM    Physical Exam: Vitals:   01/25/24 0430 01/25/24 0558 01/25/24 0600 01/25/24 0649  BP: 128/80 121/83 121/83 (!) 122/93  Pulse: 74 66 68 79  Resp: 10 17 (!) 24 18  Temp:  98.9 F (37.2 C)  98.6 F (37 C)  TempSrc:  Oral    SpO2: 100% 97% 98% 98%  Weight:      Height:        Constitutional: Middle-aged female currently in no acute distress and able to follow commands Eyes: PERRL, lids and conjunctivae normal ENMT: Mucous membranes are moist.  Normal dentition.  Neck: normal, supple  Respiratory: clear to auscultation bilaterally, no wheezing, no crackles. Normal respiratory effort. No accessory muscle use.  Cardiovascular: Regular rate and rhythm, no murmurs / rubs / gallops.   2+ pedal pulses. No carotid bruits.  Abdomen: no tenderness, no masses palpated.  Bowel sounds positive.  Musculoskeletal: no clubbing / cyanosis. No joint deformity upper and lower extremities. Good ROM, no contractures. Normal muscle tone.  Skin: no rashes, lesions, ulcers. No induration Neurologic: CN 2-12 grossly intact.  Strength 5/5 in all 4.  Psychiatric: Normal judgment and insight. Alert and oriented x 3. Normal mood.   Data Reviewed:  EKG revealed normal sinus rhythm at 60 bpm.  Reviewed labs, imaging, and pertinent records as documented.  Assessment and Plan:  Transaminitis Acute.  Patient presented with complaints of night sweats, cyclical fevers, and nausea after spending extended period time in [place] prior to [place] to the [place]  about 1 week ago.  Labs significant for AST 1607, ALT 1995, alkaline phosphatase 159, total bilirubin 2.0 with direct predominance.  Right upper quadrant ultrasound noted a contracted gallbladder with polyp present, but no other acute abnormality.  Case have been  discussed with ID who gave recommendations including checking for hepatitis, peripheral smear, and assessment for malaria.  The peripheral smear did not show any acute abnormality.  INR was within normal limits.  On the differential includes hepatitis, malaria/similar infectious disease, or possibly related to alcohol use. - Admit to a telemetry bed - Follow-up blood cultures - Follow-up hepatitis panel, Plasmodium PCR - Check CK given reports of running 5K's on a regular - Continue to trend LFTs - Continue IV fluids   Leukopenia Acute.  WBC noted to be 3.4.  Thought possibly related to above. - Continue to monitor  Hyponatremia Hypochloremia Acute.  Patient presented with sodium 125 and chloride 89.  Urine osmolarity was low at 56, urine sodium 16 low, and low serum osmolarity 262 confirm diagnosis of hypotonic hyponatremia. - Goal correction no more than 6-8 meq/Lliter in a 24-hour period - Serial BMPs every 6 hours - Adjust IV fluids as deemed medically appropriate  Normocytic anemia Hemoglobin noted to be 11.8.  No reports of bleeding - Continue to monitor  Depression Patient had previously been on Lexapro , but recently weaned herself off.  This should not be a cause for patient's  elevated liver enzymes. - Recommend outpatient follow-up with primary  Gallbladder Polyp Acute.  Right upper quadrant ultrasound revealed a revealed 4.4 mm echogenic nonshadowing gallbladder wall polyp.  Normally thought to be benign if less then 7 mm. -  DVT prophylaxis: Lovenox  Advance Care Planning:   Code Status: Full Code   Consults: Case had been initially discussed with ID  Family Communication: None  Severity of Illness: The appropriate patient status for this patient is INPATIENT. Inpatient status is judged to be reasonable and necessary in order to provide the required intensity of service to ensure the patient's safety. The patient's presenting symptoms, physical exam findings, and  initial radiographic and laboratory data in the context of their chronic comorbidities is felt to place them at high risk for further clinical deterioration. Furthermore, it is not anticipated that the patient will be medically stable for discharge from the hospital within 2 midnights of admission.   * I certify that at the point of admission it is my clinical judgment that the patient will require inpatient hospital care spanning beyond 2 midnights from the point of admission due to high intensity of service, high risk for further deterioration and high frequency of surveillance required.*  Author: Maximino DELENA Sharps, MD 01/25/2024 7:26 AM  For on call review [email or web address].

## 2024-01-25 NOTE — ED Notes (Signed)
 Pt ambulated to and from bathroom with steady gait. No complaints at this time.

## 2024-01-25 NOTE — Plan of Care (Signed)
  Problem: Education: Goal: Knowledge of General Education information will improve Description: Including pain rating scale, medication(s)/side effects and non-pharmacologic comfort measures Outcome: Progressing   Problem: Health Behavior/Discharge Planning: Goal: Ability to manage health-related needs will improve Outcome: Progressing   Problem: Clinical Measurements: Goal: Cardiovascular complication will be avoided Outcome: Progressing   Problem: Activity: Goal: Risk for activity intolerance will decrease Outcome: Progressing   Problem: Nutrition: Goal: Adequate nutrition will be maintained Outcome: Progressing   Problem: Coping: Goal: Level of anxiety will decrease Outcome: Progressing   Problem: Elimination: Goal: Will not experience complications related to bowel motility Outcome: Progressing Goal: Will not experience complications related to urinary retention Outcome: Progressing   Problem: Pain Managment: Goal: General experience of comfort will improve and/or be controlled Outcome: Progressing   Problem: Safety: Goal: Ability to remain free from injury will improve Outcome: Progressing   Problem: Skin Integrity: Goal: Risk for impaired skin integrity will decrease Outcome: Progressing

## 2024-01-26 ENCOUNTER — Other Ambulatory Visit: Payer: Self-pay | Admitting: Infectious Disease

## 2024-01-26 DIAGNOSIS — D649 Anemia, unspecified: Secondary | ICD-10-CM

## 2024-01-26 DIAGNOSIS — B159 Hepatitis A without hepatic coma: Secondary | ICD-10-CM | POA: Diagnosis not present

## 2024-01-26 DIAGNOSIS — R748 Abnormal levels of other serum enzymes: Principal | ICD-10-CM

## 2024-01-26 LAB — COMPREHENSIVE METABOLIC PANEL WITH GFR
ALT: 1982 U/L — ABNORMAL HIGH (ref 0–44)
AST: 1707 U/L — ABNORMAL HIGH (ref 15–41)
Albumin: 3.3 g/dL — ABNORMAL LOW (ref 3.5–5.0)
Alkaline Phosphatase: 141 U/L — ABNORMAL HIGH (ref 38–126)
Anion gap: 10 (ref 5–15)
BUN: <5 mg/dL — ABNORMAL LOW (ref 6–20)
CO2: 25 mmol/L (ref 22–32)
Calcium: 8.7 mg/dL — ABNORMAL LOW (ref 8.9–10.3)
Chloride: 98 mmol/L (ref 98–111)
Creatinine, Ser: 0.62 mg/dL (ref 0.44–1.00)
GFR, Estimated: >60 mL/min (ref >60)
Glucose, Bld: 130 mg/dL — ABNORMAL HIGH (ref 70–99)
Potassium: 3.8 mmol/L (ref 3.5–5.1)
Sodium: 133 mmol/L — ABNORMAL LOW (ref 135–145)
Total Bilirubin: 3.3 mg/dL — ABNORMAL HIGH (ref 0.0–1.2)
Total Protein: 7 g/dL (ref 6.5–8.1)

## 2024-01-26 LAB — HEPATITIS PANEL, ACUTE
HCV Ab: NONREACTIVE
Hep A IgM: REACTIVE — AB
Hep B C IgM: NONREACTIVE
Hepatitis B Surface Ag: NONREACTIVE

## 2024-01-26 LAB — CBC
HCT: 38.7 % (ref 36.0–46.0)
Hemoglobin: 13 g/dL (ref 12.0–15.0)
MCH: 31.2 pg (ref 26.0–34.0)
MCHC: 33.6 g/dL (ref 30.0–36.0)
MCV: 92.8 fL (ref 80.0–100.0)
Platelets: 220 K/uL (ref 150–400)
RBC: 4.17 MIL/uL (ref 3.87–5.11)
RDW: 12.5 % (ref 11.5–15.5)
WBC: 4.1 K/uL (ref 4.0–10.5)
nRBC: 0 % (ref 0.0–0.2)

## 2024-01-26 LAB — PLASMODIUM SP. PCR: Plasmodium Sp. PCR: NEGATIVE

## 2024-01-26 NOTE — Consult Note (Signed)
 Date of Admission:  01/24/2024          Reason for Consult: Acute Hepatitis A    Referring Provider: Renato Applebaum, MD   Assessment:  Acute hepatitis A Hx of ?  Dengue Hx of weekend binge drinking Depression    Plan:  Would recheck her CMP tonight vs tomorrow am She has appt to have labs checked at [facility] on July 21st Refrain from alcohol until LFTs have completely normalized and would avoid it for an additional week    Principal Problem:   Acute hepatitis A Active Problems:   Transaminitis   Leukopenia   Hyponatremia   Hypochloremia   Normocytic anemia   Depression   Gallbladder polyp   Elevated liver enzymes   Anemia   Scheduled Meds:  sodium chloride  flush  3 mL Intravenous Q12H   Continuous Infusions: PRN Meds:.albuterol , ondansetron  **OR** ondansetron  (ZOFRAN ) IV  HPI: Katrina Jacobson is a 43 y.o. female largely resides in [facility] [place] who presented to the ER with a history of fever night sweats nausea.  She have a habit of been drinking on the weekend but did not binge drink prior to her coming to the hospital in the ER she not have an acute trampolines hepatitis.  Leukocytosis hyponatremia.  Workup was undertaken and her hepatitis A IgM came back positive.  Her liver function tests seem to be plateauing that have not gone down yet.  I would favor rechecking LFTs perhaps later tonight or tomorrow and then discharging the patient.  [place] is going to the beach for a [place] I have also scheduled a follow-up lab appointment for her in our clinic where she will have a repeat CMP.   I have personally spent 82 minutes involved in face-to-face and non-face-to-face activities for this patient on the day of the visit. Professional time spent includes the following activities: Preparing to see the patient (review of tests), Obtaining and/or reviewing separately obtained history (admission/discharge record), Performing a medically appropriate examination and/or  evaluation , Ordering medications/tests/procedures, referring and communicating with other health care professionals, Documenting clinical information in the EMR, Independently interpreting results (not separately reported), Communicating results to the patient/family/caregiver, Counseling and educating the patient/family/caregiver and Care coordination (not separately reported).   Evaluation of the patient requires complex antimicrobial therapy evaluation, counseling , isolation needs to reduce disease transmission and risk assessment and mitigation.     Review of Systems: Review of Systems  Constitutional:  Positive for chills and fever. Negative for malaise/fatigue and weight loss.  HENT:  Negative for congestion and sore throat.   Eyes:  Negative for blurred vision and photophobia.  Respiratory:  Negative for cough, shortness of breath and wheezing.   Cardiovascular:  Negative for chest pain, palpitations and leg swelling.  Gastrointestinal:  Positive for nausea. Negative for abdominal pain, blood in stool, constipation, diarrhea, heartburn, melena and vomiting.  Genitourinary:  Negative for dysuria, flank pain and hematuria.  Musculoskeletal:  Negative for back pain, falls, joint pain and myalgias.  Skin:  Negative for itching and rash.  Neurological:  Negative for dizziness, focal weakness, loss of consciousness, weakness and headaches.  Endo/Heme/Allergies:  Does not bruise/bleed easily.  Psychiatric/Behavioral:  Negative for depression and suicidal ideas. The patient does not have insomnia.     Past Medical History:  Diagnosis Date   Medical history non-contributory     Social History   Tobacco Use   Smoking status: Never   Smokeless tobacco: Never  Vaping Use   Vaping status: Never Used  Substance Use Topics   Alcohol use: Yes    Comment: occ   Drug use: Never    Family History  Problem Relation Age of Onset   Hypertension Mother    Hypertension Father    Breast  cancer Maternal Grandmother    Allergies  Allergen Reactions   Sulfa Antibiotics    Latex Itching and Rash    OBJECTIVE: Blood pressure 119/87, pulse 80, temperature 98.3 F (36.8 C), temperature source Oral, resp. rate 18, height 5' 9 (1.753 m), weight 74.8 kg, last menstrual period 01/15/2024, SpO2 100%.  Physical Exam Constitutional:      General: She is not in acute distress.    Appearance: Normal appearance. She is well-developed. She is not ill-appearing or diaphoretic.  HENT:     Head: Normocephalic and atraumatic.     Right Ear: Hearing and external ear normal.     Left Ear: Hearing and external ear normal.     Nose: No nasal deformity or rhinorrhea.  Eyes:     General: No scleral icterus.    Conjunctiva/sclera: Conjunctivae normal.     Right eye: Right conjunctiva is not injected.     Left eye: Left conjunctiva is not injected.     Pupils: Pupils are equal, round, and reactive to light.  Neck:     Vascular: No JVD.  Cardiovascular:     Rate and Rhythm: Normal rate and regular rhythm.     Heart sounds: S1 normal and S2 normal.  Pulmonary:     Effort: Pulmonary effort is normal. No respiratory distress.     Breath sounds: No wheezing.  Abdominal:     General: There is no distension.     Palpations: Abdomen is soft.  Musculoskeletal:        General: Normal range of motion.     Right shoulder: Normal.     Left shoulder: Normal.     Cervical back: Normal range of motion and neck supple.     Right hip: Normal.     Left hip: Normal.     Right knee: Normal.     Left knee: Normal.  Lymphadenopathy:     Head:     Right side of head: No submandibular, preauricular or posterior auricular adenopathy.     Left side of head: No submandibular, preauricular or posterior auricular adenopathy.     Cervical: No cervical adenopathy.     Right cervical: No superficial or deep cervical adenopathy.    Left cervical: No superficial or deep cervical adenopathy.  Skin:     General: Skin is warm and dry.     Coloration: Skin is not pale.     Findings: No abrasion, bruising, ecchymosis, erythema, lesion or rash.     Nails: There is no clubbing.  Neurological:     General: No focal deficit present.     Mental Status: She is alert and oriented to person, place, and time.     Sensory: No sensory deficit.     Coordination: Coordination normal.     Gait: Gait normal.  Psychiatric:        Attention and Perception: She is attentive.        Mood and Affect: Mood normal.        Speech: Speech normal.        Behavior: Behavior normal. Behavior is cooperative.        Thought Content: Thought content normal.  Judgment: Judgment normal.     Lab Results Lab Results  Component Value Date   WBC 4.1 01/26/2024   HGB 13.0 01/26/2024   HCT 38.7 01/26/2024   MCV 92.8 01/26/2024   PLT 220 01/26/2024    Lab Results  Component Value Date   CREATININE 0.62 01/26/2024   BUN <5 (L) 01/26/2024   NA 133 (L) 01/26/2024   K 3.8 01/26/2024   CL 98 01/26/2024   CO2 25 01/26/2024    Lab Results  Component Value Date   ALT 1,982 (H) 01/26/2024   AST 1,707 (H) 01/26/2024   ALKPHOS 141 (H) 01/26/2024   BILITOT 3.3 (H) 01/26/2024     Microbiology: Recent Results (from the past 240 hours)  Culture, blood (single)     Status: None (Preliminary result)   Collection Time: 01/25/24  1:07 AM   Specimen: BLOOD LEFT ARM  Result Value Ref Range Status   Specimen Description   Final    BLOOD LEFT ARM Performed at [facility], [address] [place], [place] [address]    Special Requests   Final    BOTTLES DRAWN AEROBIC AND ANAEROBIC Blood Culture adequate volume Performed at [facility], [address] [place], [place] [address]    Culture   Final    NO GROWTH 1 DAY Performed at [facility], [address], [place], [place] [address]    Report Status PENDING  Incomplete    Jomarie Fleeta Rothman, MD [facility] for  Infectious Disease [facility] [phone] pager  01/26/2024, 3:16 PM

## 2024-01-26 NOTE — Progress Notes (Signed)
 PROGRESS NOTE    ZEVA LEBER  FMW:[id number] DOB: [birth year] DOA: 01/24/2024 PCP: Pcp, No    Brief Narrative:  43 year old with history of depression presented to the emergency room with fever, night sweats and nausea for about 1 week. Patient reported temperature 99-101 intermittently over the last 1 week.  Also reported GI symptoms including poor digestion when eating.  Patient has been binge drinking once a week after and consuming more than 10 drinks.  Patient lives in [place] for 1 year and returned 1 month ago. In the emergency room afebrile, mildly tachycardic.  Blood pressure stable.  Sodium 125 along with AST and ALT 1607/1995, total bilirubin 2, WC count 3.4.  Case discussed with infectious disease.  Admitted with acute transaminitis, hyponatremia.  Subjective: Patient seen and examined.  Currently denies any complaints.  Multiple questions answered.  Found to have acute hepatitis A.   Assessment & Plan:   Febrile illness with leukopenia, acute hepatitis and transaminitis: Positive for acute hepatitis A. Presented with night sweats and fever.  [place]  from [place] 1 month ago. Transaminases are elevated, however remains stable on repeat exam.  INR is normal. Right upper quadrant ultrasound with contracted gallbladder. Acute hepatitis panel, positive for hepatitis A anti-IgM.  Hepatitis B and C-. HIV screen negative. Peripheral smear for malaria, pending.  Plasmodium PCR pending. Blood cultures pending.  Negative so far. Appreciate ID input.  Continue provide supportive care. Recheck liver function test tomorrow morning to ensure stabilization and plan is supportive care and outpatient follow-up.  Hypochloremic hyponatremia: Presented with sodium 125 and chloride 89. Treated with isotonic fluid and patient is already improving.- 133 now. Avoid further fluids, will allow to compensate.   History of depression: History of and Lexapro  she weaned her off.  Recommend  outpatient follow-up.    DVT prophylaxis: Mobilize.   Code Status: Full code Family Communication: None. Disposition Plan: Status is: Inpatient Remains inpatient appropriate because: Investigation for febrile illness     Consultants:  Infectious disease Gastroenterology, curbside  Procedures:  None  Antimicrobials:  None     Objective: Vitals:   01/25/24 1753 01/25/24 2133 01/26/24 0521 01/26/24 0821  BP: 103/79 111/76 109/69 119/87  Pulse: 74 65 (!) 57 80  Resp:  18    Temp: 98 F (36.7 C) 98.9 F (37.2 C) 98.8 F (37.1 C) 98.3 F (36.8 C)  TempSrc: Oral  Oral Oral  SpO2: 98%  98% 100%  Weight:      Height:        Intake/Output Summary (Last 24 hours) at 01/26/2024 1347 Last data filed at 01/26/2024 1102 Gross per 24 hour  Intake 1440 ml  Output --  Net 1440 ml   Filed Weights   01/24/24 2351  Weight: 74.8 kg    Examination:  General: Looks fairly comfortable.  Interactive and pleasant. Cardiovascular: S1-S2 normal.  Regular rate rhythm. Respiratory: Bilateral clear.  No added sounds. Gastrointestinal: Soft and nontender.  Bowel sound present. Ext: No swelling or edema.  No cyanosis. Neuro: Alert awake oriented. Musculoskeletal: No deformities. Skin: Intact.  No rashes.      Data Reviewed: I have personally reviewed following labs and imaging studies  CBC: Recent Labs  Lab 01/25/24 0003 01/26/24 0526  WBC 3.4* 4.1  NEUTROABS 1.5*  --   HGB 11.8* 13.0  HCT 33.5* 38.7  MCV 88.6 92.8  PLT 169 220   Basic Metabolic Panel: Recent Labs  Lab 01/25/24 0003 01/25/24 0819 01/25/24 1720 01/26/24  0526  NA 125* 131* 135 133*  K 3.8 3.8 3.7 3.8  CL 89* 98 104 98  CO2 26 24 21* 25  GLUCOSE 98 88 141* 130*  BUN <5* <5* <5* <5*  CREATININE 0.48 0.53 0.53 0.62  CALCIUM  8.7* 8.5* 8.8* 8.7*   GFR: Estimated Creatinine Clearance: 94.8 mL/min (by C-G formula based on SCr of 0.62 mg/dL). Liver Function Tests: Recent Labs  Lab  01/25/24 0107 01/25/24 0819 01/26/24 0526  AST 1,607* 1,559* 1,707*  ALT 1,995* 1,798* 1,982*  ALKPHOS 159* 131* 141*  BILITOT 2.0* 2.8* 3.3*  PROT 7.0 6.8 7.0  ALBUMIN 3.8 3.3* 3.3*   Recent Labs  Lab 01/25/24 0003  LIPASE 47   No results for input(s): AMMONIA  in the last 168 hours. Coagulation Profile: Recent Labs  Lab 01/25/24 0312  INR 1.0   Cardiac Enzymes: Recent Labs  Lab 01/25/24 0819  CKTOTAL 45   BNP (last 3 results) No results for input(s): PROBNP in the last 8760 hours. HbA1C: No results for input(s): HGBA1C in the last 72 hours. CBG: No results for input(s): GLUCAP in the last 168 hours. Lipid Profile: No results for input(s): CHOL, HDL, LDLCALC, TRIG, CHOLHDL, LDLDIRECT in the last 72 hours. Thyroid Function Tests: No results for input(s): TSH, T4TOTAL, FREET4, T3FREE, THYROIDAB in the last 72 hours. Anemia Panel: No results for input(s): VITAMINB12, FOLATE, FERRITIN, TIBC, IRON , RETICCTPCT in the last 72 hours. Sepsis Labs: Recent Labs  Lab 01/25/24 9687  LATICACIDVEN 0.6    Recent Results (from the past 240 hours)  Culture, blood (single)     Status: None (Preliminary result)   Collection Time: 01/25/24  1:07 AM   Specimen: BLOOD LEFT ARM  Result Value Ref Range Status   Specimen Description   Final    BLOOD LEFT ARM Performed at [facility], [address] [place], [place] [address]    Special Requests   Final    BOTTLES DRAWN AEROBIC AND ANAEROBIC Blood Culture adequate volume Performed at [facility], [address] [place], [place] [address]    Culture   Final    NO GROWTH 1 DAY Performed at [facility], [address], [place], [place] [address]    Report Status PENDING  Incomplete         Radiology Studies: US  Abdomen Limited RUQ (LIVER/GB) Result Date: 01/25/2024 CLINICAL DATA:  212411.  Elevated liver enzymes. EXAM: ULTRASOUND ABDOMEN LIMITED  RIGHT UPPER QUADRANT COMPARISON:  None. FINDINGS: Gallbladder: The gallbladder is nearly completely empty, contracted and with exaggerated wall thickness of 2.8 mm. No stones or pericholecystic fluid are identified and no positive sonographic Murphy sign. There is a 4.4 mm echogenic nonshadowing wall polyp in the proximal gallbladder. No other focal luminal abnormality. Common bile duct: Diameter: 2.4 mm.  No intrahepatic biliary dilatation. Liver: No focal lesion identified. Within normal limits in parenchymal echogenicity. Portal vein is patent on color Doppler imaging with normal direction of blood flow towards the liver. Other: None. IMPRESSION: 1. Nearly completely empty gallbladder with exaggerated wall thickness of 2.8 mm. No stones or pericholecystic fluid are identified and no positive sonographic Murphy sign. 2. 4.4 mm echogenic nonshadowing wall polyp in the proximal gallbladder. 3. No other focal abnormality. Electronically Signed   By: Francis Quam M.D.   On: 01/25/2024 05:26        Scheduled Meds:  sodium chloride  flush  3 mL Intravenous Q12H   Continuous Infusions:   LOS: 1  day    Time spent: 51 minutes    Renato Applebaum, MD [facility]

## 2024-01-27 LAB — COMPREHENSIVE METABOLIC PANEL WITH GFR
ALT: 1770 U/L — ABNORMAL HIGH (ref 0–44)
AST: 1204 U/L — ABNORMAL HIGH (ref 15–41)
Albumin: 3.2 g/dL — ABNORMAL LOW (ref 3.5–5.0)
Alkaline Phosphatase: 138 U/L — ABNORMAL HIGH (ref 38–126)
Anion gap: 8 (ref 5–15)
BUN: <5 mg/dL — ABNORMAL LOW (ref 6–20)
CO2: 28 mmol/L (ref 22–32)
Calcium: 8.7 mg/dL — ABNORMAL LOW (ref 8.9–10.3)
Chloride: 101 mmol/L (ref 98–111)
Creatinine, Ser: 0.61 mg/dL (ref 0.44–1.00)
GFR, Estimated: >60 mL/min (ref >60)
Glucose, Bld: 91 mg/dL (ref 70–99)
Potassium: 3.7 mmol/L (ref 3.5–5.1)
Sodium: 137 mmol/L (ref 135–145)
Total Bilirubin: 3.5 mg/dL — ABNORMAL HIGH (ref 0.0–1.2)
Total Protein: 7 g/dL (ref 6.5–8.1)

## 2024-01-27 MED ORDER — ONDANSETRON HCL 4 MG PO TABS: 4.0000 mg | ORAL_TABLET | Freq: Four times a day (QID) | ORAL | 0 refills | Status: AC | PRN

## 2024-01-27 NOTE — Discharge Summary (Signed)
 Physician Discharge Summary  Katrina Jacobson FMW:[id number] DOB: [birth year] DOA: 01/24/2024  PCP: Pcp, No  Admit date: 01/24/2024 Discharge date: 01/27/2024  Admitted From: Home Disposition: Home  Recommendations for Outpatient Follow-up:  Follow up with PCP in 1-2 weeks Please obtain CMP/CBC in one week ID clinic to follow-up.  Discharge Condition: Stable CODE STATUS: Full code Diet recommendation: Regular diet  Discharge summary: 43 year old with history of depression on Lexapro  presented to the emergency room with fever, night sweats and nausea for about 1 week. Patient reported temperature 99-101 intermittently over the last 1 week.  Also reported GI symptoms including poor digestion when eating.  Patient has been binge drinking once a week after and consuming more than 10 drinks.  Patient lived in [place] for 1 year and returned 1 month ago. In the emergency room afebrile, mildly tachycardic.  Blood pressure stable.  Sodium 125 along with AST and ALT 1607/1995, total bilirubin 2, WC count 3.4.  Case discussed with infectious disease.  Admitted with acute transaminitis, hyponatremia.  Acute hepatitis A:  Febrile illness with leukopenia, acute hepatitis and transaminitis: Positive for acute hepatitis A. Presented with night sweats and fever.  [place]  from [place] 1 [place] ago. Transaminases are elevated, however remains stable on repeat exam and downtrending.  INR is normal. Right upper quadrant ultrasound with contracted gallbladder. Acute hepatitis panel, positive for hepatitis A anti-IgM.  Hepatitis B and C-. HIV screen negative. Blood cultures negative. Appreciate ID input.  Continue provide supportive care. Supportive care.  Going home today.  ID clinic to reschedule follow-up in 1 week to repeat LFTs. Total abstinence from alcohol and acetaminophen  and containing products.   Hypochloremic hyponatremia: Presented with sodium 125 and chloride 89. Improved with normal  saline.  Recheck next week.   Continue Lexapro .  Stable for discharge.  Discharge Diagnoses:  Principal Problem:   Acute hepatitis A Active Problems:   Transaminitis   Leukopenia   Hyponatremia   Hypochloremia   Normocytic anemia   Depression   Gallbladder polyp   Elevated liver enzymes   Anemia    Discharge Instructions  Discharge Instructions     Diet general   Complete by: As directed    Increase activity slowly   Complete by: As directed       Allergies as of 01/27/2024       Reactions   Sulfa Antibiotics    Latex Itching, Rash        Medication List     TAKE these medications    escitalopram  10 MG tablet Commonly known as: LEXAPRO  Take 1 tablet (10 mg total) by mouth daily.   ibuprofen  200 MG tablet Commonly known as: ADVIL  Take 200-800 mg by mouth every 6 (six) hours as needed for moderate pain (pain score 4-6).   ondansetron  4 MG tablet Commonly known as: ZOFRAN  Take 1 tablet (4 mg total) by mouth every 6 (six) hours as needed for nausea.        Allergies  Allergen Reactions   Sulfa Antibiotics    Latex Itching and Rash    Consultations: Infectious disease   Procedures/Studies: US  Abdomen Limited RUQ (LIVER/GB) Result Date: 01/25/2024 CLINICAL DATA:  212411.  Elevated liver enzymes. EXAM: ULTRASOUND ABDOMEN LIMITED RIGHT UPPER QUADRANT COMPARISON:  None. FINDINGS: Gallbladder: The gallbladder is nearly completely empty, contracted and with exaggerated wall thickness of 2.8 mm. No stones or pericholecystic fluid are identified and no positive sonographic Murphy sign. There is a 4.4 mm echogenic nonshadowing wall  polyp in the proximal gallbladder. No other focal luminal abnormality. Common bile duct: Diameter: 2.4 mm.  No intrahepatic biliary dilatation. Liver: No focal lesion identified. Within normal limits in parenchymal echogenicity. Portal vein is patent on color Doppler imaging with normal direction of blood flow towards the liver.  Other: None. IMPRESSION: 1. Nearly completely empty gallbladder with exaggerated wall thickness of 2.8 mm. No stones or pericholecystic fluid are identified and no positive sonographic Murphy sign. 2. 4.4 mm echogenic nonshadowing wall polyp in the proximal gallbladder. 3. No other focal abnormality. Electronically Signed   By: Francis Quam M.D.   On: 01/25/2024 05:26   (Echo, Carotid, EGD, Colonoscopy, ERCP)    Subjective: Seen in the morning rounds.  Denies any complaints.  Results discussed and updated.  Eager to go home.   Discharge Exam: Vitals:   01/27/24 0744 01/27/24 0745  BP: 100/65 100/65  Pulse: (!) 54 (!) 54  Resp: 18 18  Temp: 98 F (36.7 C) 98 F (36.7 C)  SpO2: 98% 98%   Vitals:   01/26/24 2120 01/27/24 0630 01/27/24 0744 01/27/24 0745  BP: 108/78 100/66 100/65 100/65  Pulse: 61 73 (!) 54 (!) 54  Resp: 18 18 18 18   Temp: 98 F (36.7 C) 98.3 F (36.8 C) 98 F (36.7 C) 98 F (36.7 C)  TempSrc: Oral Oral Oral Oral  SpO2: 100% 100% 98% 98%  Weight:      Height:        General: Pt is alert, awake, not in acute distress Cardiovascular: RRR, S1/S2 +, no rubs, no gallops Respiratory: CTA bilaterally, no wheezing, no rhonchi Abdominal: Soft, NT, ND, bowel sounds + Extremities: no edema, no cyanosis    The results of significant diagnostics from this hospitalization (including imaging, microbiology, ancillary and laboratory) are listed below for reference.     Microbiology: Recent Results (from the past 240 hours)  Culture, blood (single)     Status: None (Preliminary result)   Collection Time: 01/25/24  1:07 AM   Specimen: BLOOD LEFT ARM  Result Value Ref Range Status   Specimen Description   Final    BLOOD LEFT ARM Performed at [facility], [address] [place], [place] [address]    Special Requests   Final    BOTTLES DRAWN AEROBIC AND ANAEROBIC Blood Culture adequate volume Performed at [facility], [address] [place], [place] [address]    Culture   Final    NO GROWTH 2 DAYS Performed at [facility], [address], [place], [place] [address]    Report Status PENDING  Incomplete     Labs: BNP (last 3 results) No results for input(s): BNP in the last 8760 hours. Basic Metabolic Panel: Recent Labs  Lab 01/25/24 0003 01/25/24 0819 01/25/24 1720 01/26/24 0526 01/27/24 0526  NA 125* 131* 135 133* 137  K 3.8 3.8 3.7 3.8 3.7  CL 89* 98 104 98 101  CO2 26 24 21* 25 28  GLUCOSE 98 88 141* 130* 91  BUN <5* <5* <5* <5* <5*  CREATININE 0.48 0.53 0.53 0.62 0.61  CALCIUM  8.7* 8.5* 8.8* 8.7* 8.7*   Liver Function Tests: Recent Labs  Lab 01/25/24 0107 01/25/24 0819 01/26/24 0526 01/27/24 0526  AST 1,607* 1,559* 1,707* 1,204*  ALT 1,995* 1,798* 1,982* 1,770*  ALKPHOS 159* 131* 141* 138*  BILITOT 2.0* 2.8* 3.3* 3.5*  PROT 7.0 6.8 7.0 7.0  ALBUMIN 3.8 3.3* 3.3* 3.2*   Recent Labs  Lab 01/25/24 0003  LIPASE 47   No results for input(s): AMMONIA  in the last 168 hours. CBC: Recent Labs  Lab 01/25/24 0003 01/26/24 0526  WBC 3.4* 4.1  NEUTROABS 1.5*  --   HGB 11.8* 13.0  HCT 33.5* 38.7  MCV 88.6 92.8  PLT 169 220   Cardiac Enzymes: Recent Labs  Lab 01/25/24 0819  CKTOTAL 45   BNP: Invalid input(s): POCBNP CBG: No results for input(s): GLUCAP in the last 168 hours. D-Dimer No results for input(s): DDIMER in the last 72 hours. Hgb A1c No results for input(s): HGBA1C in the last 72 hours. Lipid Profile No results for input(s): CHOL, HDL, LDLCALC, TRIG, CHOLHDL, LDLDIRECT in the last 72 hours. Thyroid function studies No results for input(s): TSH, T4TOTAL, T3FREE, THYROIDAB in the last 72 hours.  Invalid input(s): FREET3 Anemia work up No results for input(s): VITAMINB12, FOLATE, FERRITIN, TIBC, IRON , RETICCTPCT in the last 72 hours. Urinalysis    Component Value Date/Time   COLORURINE YELLOW 01/25/2024 0003    APPEARANCEUR CLEAR 01/25/2024 0003   LABSPEC <1.005 (L) 01/25/2024 0003   PHURINE 7.5 01/25/2024 0003   GLUCOSEU NEGATIVE 01/25/2024 0003   HGBUR NEGATIVE 01/25/2024 0003   BILIRUBINUR NEGATIVE 01/25/2024 0003   KETONESUR NEGATIVE 01/25/2024 0003   PROTEINUR NEGATIVE 01/25/2024 0003   NITRITE NEGATIVE 01/25/2024 0003   LEUKOCYTESUR NEGATIVE 01/25/2024 0003   Sepsis Labs Recent Labs  Lab 01/25/24 0003 01/26/24 0526  WBC 3.4* 4.1   Microbiology Recent Results (from the past 240 hours)  Culture, blood (single)     Status: None (Preliminary result)   Collection Time: 01/25/24  1:07 AM   Specimen: BLOOD LEFT ARM  Result Value Ref Range Status   Specimen Description   Final    BLOOD LEFT ARM Performed at [facility], [address], [place], [place] [address]    Special Requests   Final    BOTTLES DRAWN AEROBIC AND ANAEROBIC Blood Culture adequate volume Performed at [facility], [address], [place], [place] [address]    Culture   Final    NO GROWTH 2 DAYS Performed at [facility], [address] [place], [place] [address]    Report Status PENDING  Incomplete     Time coordinating discharge: 32 minutes  SIGNED:   Renato Applebaum, MD  [facility] 01/27/2024, 8:52 AM

## 2024-01-30 LAB — CULTURE, BLOOD (SINGLE)
Culture: NO GROWTH
Special Requests: ADEQUATE

## 2024-02-05 ENCOUNTER — Other Ambulatory Visit: Payer: Self-pay

## 2024-02-05 DIAGNOSIS — B159 Hepatitis A without hepatic coma: Secondary | ICD-10-CM

## 2024-02-05 LAB — MISC LABCORP TEST (SEND OUT)
LabCorp test name: 15085
Labcorp test code: 9985

## 2024-02-06 ENCOUNTER — Ambulatory Visit: Payer: Self-pay

## 2024-02-06 LAB — COMPREHENSIVE METABOLIC PANEL WITH GFR
AG Ratio: 1.1 (calc) (ref 1.0–2.5)
ALT: 514 U/L — ABNORMAL HIGH (ref 6–29)
AST: 212 U/L — ABNORMAL HIGH (ref 10–30)
Albumin: 4 g/dL (ref 3.6–5.1)
Alkaline phosphatase (APISO): 134 U/L — ABNORMAL HIGH (ref 31–125)
BUN: 7 mg/dL (ref 7–25)
CO2: 31 mmol/L (ref 20–32)
Calcium: 9.7 mg/dL (ref 8.6–10.2)
Chloride: 100 mmol/L (ref 98–110)
Creat: 0.57 mg/dL (ref 0.50–0.99)
Globulin: 3.8 g/dL — ABNORMAL HIGH (ref 1.9–3.7)
Glucose, Bld: 84 mg/dL (ref 65–99)
Potassium: 4.9 mmol/L (ref 3.5–5.3)
Sodium: 136 mmol/L (ref 135–146)
Total Bilirubin: 1.2 mg/dL (ref 0.2–1.2)
Total Protein: 7.8 g/dL (ref 6.1–8.1)
eGFR: 116 mL/min/1.73m2 (ref >=60)
